# Patient Record
Sex: Female | Born: 1960 | Race: Asian | Hispanic: No | Marital: Married | Smoking: Never smoker
Health system: Southern US, Community
[De-identification: ages and names within clinical notes are randomized; demographics above are authoritative.]

## PROBLEM LIST (undated history)

## (undated) DIAGNOSIS — E785 Hyperlipidemia, unspecified: Secondary | ICD-10-CM

## (undated) DIAGNOSIS — T7840XA Allergy, unspecified, initial encounter: Secondary | ICD-10-CM

## (undated) DIAGNOSIS — H8110 Benign paroxysmal vertigo, unspecified ear: Secondary | ICD-10-CM

## (undated) DIAGNOSIS — H57059 Tonic pupil, unspecified eye: Secondary | ICD-10-CM

## (undated) DIAGNOSIS — M1990 Unspecified osteoarthritis, unspecified site: Secondary | ICD-10-CM

## (undated) HISTORY — DX: Unspecified osteoarthritis, unspecified site: M19.90

## (undated) HISTORY — DX: Benign paroxysmal vertigo, unspecified ear: H81.10

## (undated) HISTORY — DX: Hyperlipidemia, unspecified: E78.5

## (undated) HISTORY — DX: Tonic pupil, unspecified eye: H57.059

## (undated) HISTORY — DX: Allergy, unspecified, initial encounter: T78.40XA

---

## 2007-09-22 ENCOUNTER — Encounter: Payer: Self-pay | Admitting: Family Medicine

## 2007-09-24 ENCOUNTER — Encounter: Payer: Self-pay | Admitting: Family Medicine

## 2007-12-25 ENCOUNTER — Encounter: Payer: Self-pay | Admitting: Family Medicine

## 2010-06-27 ENCOUNTER — Encounter: Payer: Self-pay | Admitting: Family Medicine

## 2012-11-11 ENCOUNTER — Encounter: Payer: Self-pay | Admitting: Family Medicine

## 2014-09-06 ENCOUNTER — Encounter: Payer: Self-pay | Admitting: Family Medicine

## 2015-09-12 ENCOUNTER — Encounter: Payer: Self-pay | Admitting: Family Medicine

## 2016-07-16 DIAGNOSIS — E7800 Pure hypercholesterolemia, unspecified: Secondary | ICD-10-CM | POA: Diagnosis not present

## 2016-07-20 DIAGNOSIS — E7800 Pure hypercholesterolemia, unspecified: Secondary | ICD-10-CM | POA: Diagnosis not present

## 2016-07-20 DIAGNOSIS — Z0000 Encounter for general adult medical examination without abnormal findings: Secondary | ICD-10-CM | POA: Diagnosis not present

## 2017-03-11 ENCOUNTER — Encounter: Payer: Self-pay | Admitting: Family Medicine

## 2017-03-27 ENCOUNTER — Encounter: Payer: Self-pay | Admitting: Family Medicine

## 2017-03-27 ENCOUNTER — Ambulatory Visit (INDEPENDENT_AMBULATORY_CARE_PROVIDER_SITE_OTHER): Payer: BLUE CROSS/BLUE SHIELD | Admitting: Family Medicine

## 2017-03-27 VITALS — BP 166/81 | HR 60 | Temp 97.8°F | Resp 16 | Ht <= 58 in | Wt 115.4 lb

## 2017-03-27 DIAGNOSIS — Z1231 Encounter for screening mammogram for malignant neoplasm of breast: Secondary | ICD-10-CM | POA: Diagnosis not present

## 2017-03-27 DIAGNOSIS — R030 Elevated blood-pressure reading, without diagnosis of hypertension: Secondary | ICD-10-CM

## 2017-03-27 DIAGNOSIS — E7800 Pure hypercholesterolemia, unspecified: Secondary | ICD-10-CM | POA: Insufficient documentation

## 2017-03-27 DIAGNOSIS — Z8742 Personal history of other diseases of the female genital tract: Secondary | ICD-10-CM | POA: Diagnosis not present

## 2017-03-27 DIAGNOSIS — Z0000 Encounter for general adult medical examination without abnormal findings: Secondary | ICD-10-CM | POA: Diagnosis not present

## 2017-03-27 DIAGNOSIS — Z131 Encounter for screening for diabetes mellitus: Secondary | ICD-10-CM

## 2017-03-27 DIAGNOSIS — R102 Pelvic and perineal pain: Secondary | ICD-10-CM | POA: Diagnosis not present

## 2017-03-27 DIAGNOSIS — Z23 Encounter for immunization: Secondary | ICD-10-CM | POA: Diagnosis not present

## 2017-03-27 DIAGNOSIS — Z124 Encounter for screening for malignant neoplasm of cervix: Secondary | ICD-10-CM | POA: Diagnosis not present

## 2017-03-27 LAB — POCT URINALYSIS DIP (MANUAL ENTRY)
Bilirubin, UA: NEGATIVE
Glucose, UA: NEGATIVE mg/dL
Ketones, POC UA: NEGATIVE mg/dL
LEUKOCYTES UA: NEGATIVE
Nitrite, UA: NEGATIVE
PROTEIN UA: NEGATIVE mg/dL
SPEC GRAV UA: 1.01 (ref 1.010–1.025)
UROBILINOGEN UA: 0.2 U/dL
pH, UA: 7 (ref 5.0–8.0)

## 2017-03-27 NOTE — Patient Instructions (Addendum)
Recommend 3 servings of dairy (yogurt, skim or 2% milk, cheese, or ice cream) daily. Check blood pressure once daily every morning.  Bring blood pressure monitor with you to next visit in 4-6 weeks.   IF you received an x-ray today, you will receive an invoice from [facility]. Please contact [facility] at [phone] with questions or concerns regarding your invoice.   IF you received labwork today, you will receive an invoice from [facility]. Please contact [facility] at [phone] with questions or concerns regarding your invoice.   Our billing staff will not be able to assist you with questions regarding bills from these companies.  You will be contacted with the lab results as soon as they are available. The fastest way to get your results is to activate your My Chart account. Instructions are located on the last page of this paperwork. If you have not heard from [place] regarding the results in 2 weeks, please contact this office.     We recommend that you schedule a mammogram for breast cancer screening. Typically, you do not need a referral to do this. Please contact a local imaging center to schedule your mammogram.  [facility] - [phone]  *ask for the [facility] [place] ([place]) - [phone] or [phone]  [facility] - [phone] [place] [phone] [facility] - [phone]  *ask for the [facility] - [phone]  *ask for the Radiology Department [facility] - [phone]  *ask for the [place] - [phone]  Preventive Care 40-64 Years, Female Preventive care refers to lifestyle choices and visits with your health care provider that can promote health and wellness. What does preventive care include?  A yearly physical exam. This is also called an annual well  check.  Dental exams once or twice a year.  Routine eye exams. Ask your health care provider how often you should have your eyes checked.  Personal lifestyle choices, including:  Daily care of your teeth and gums.  Regular physical activity.  Eating a healthy diet.  Avoiding tobacco and drug use.  Limiting alcohol use.  Practicing safe sex.  Taking low-dose aspirin daily starting at age 46.  Taking vitamin and mineral supplements as recommended by your health care provider. What happens during an annual well check? The services and screenings done by your health care provider during your annual well check will depend on your age, overall health, lifestyle risk factors, and family history of disease. Counseling  Your health care provider may ask you questions about your:  Alcohol use.  Tobacco use.  Drug use.  Emotional well-being.  Home and relationship well-being.  Sexual activity.  Eating habits.  Work and work Statistician.  Method of birth control.  Menstrual cycle.  Pregnancy history. Screening  You may have the following tests or measurements:  Height, weight, and BMI.  Blood pressure.  Lipid and cholesterol levels. These may be checked every 5 years, or more frequently if you are over 61 years old.  Skin check.  Lung cancer screening. You may have this screening every year starting at age 65 if you have a 30-pack-year history of smoking and currently smoke or have quit within the past 15 years.  Fecal occult blood test (FOBT) of the stool. You may have this test every year starting at age 40.  Flexible sigmoidoscopy or colonoscopy. You may have a sigmoidoscopy every  5 years or a colonoscopy every 10 years starting at age 80.  Hepatitis C blood test.  Hepatitis B blood test.  Sexually transmitted disease (STD) testing.  Diabetes screening. This is done by checking your blood sugar (glucose) after you have not eaten for a while (fasting).  You may have this done every 1-3 years.  Mammogram. This may be done every 1-2 years. Talk to your health care provider about when you should start having regular mammograms. This may depend on whether you have a family history of breast cancer.  BRCA-related cancer screening. This may be done if you have a family history of breast, ovarian, tubal, or peritoneal cancers.  Pelvic exam and Pap test. This may be done every 3 years starting at age 69. Starting at age 44, this may be done every 5 years if you have a Pap test in combination with an HPV test.  Bone density scan. This is done to screen for osteoporosis. You may have this scan if you are at high risk for osteoporosis. Discuss your test results, treatment options, and if necessary, the need for more tests with your health care provider. Vaccines  Your health care provider may recommend certain vaccines, such as:  Influenza vaccine. This is recommended every year.  Tetanus, diphtheria, and acellular pertussis (Tdap, Td) vaccine. You may need a Td booster every 10 years.  Varicella vaccine. You may need this if you have not been vaccinated.  Zoster vaccine. You may need this after age 93.  Measles, mumps, and rubella (MMR) vaccine. You may need at least one dose of MMR if you were born in 1957 or later. You may also need a second dose.  Pneumococcal 13-valent conjugate (PCV13) vaccine. You may need this if you have certain conditions and were not previously vaccinated.  Pneumococcal polysaccharide (PPSV23) vaccine. You may need one or two doses if you smoke cigarettes or if you have certain conditions.  Meningococcal vaccine. You may need this if you have certain conditions.  Hepatitis A vaccine. You may need this if you have certain conditions or if you travel or work in places where you may be exposed to hepatitis A.  Hepatitis B vaccine. You may need this if you have certain conditions or if you travel or work in places where  you may be exposed to hepatitis B.  Haemophilus influenzae type b (Hib) vaccine. You may need this if you have certain conditions. Talk to your health care provider about which screenings and vaccines you need and how often you need them. This information is not intended to replace advice given to you by your health care provider. Make sure you discuss any questions you have with your health care provider. Document Released: 11/11/2015 Document Revised: 07/04/2016 Document Reviewed: 08/16/2015 Elsevier Interactive Patient Education  2017 [employer].

## 2017-03-27 NOTE — Progress Notes (Signed)
Subjective:    Patient ID: Tracy Richard, female    DOB: 1961-06-08, 56 y.o.   MRN: [id number]  03/27/2017  Annual Exam (with pap and has paperwork) and Immunizations (discuss getting Tdap)   HPI This 56 y.o. female presents for Complete Physical Examination and to establish care.  Last physical:  07-2016 Pap smear:  Not sure; no abnormal pap smears.  History of ovarian cysts.  LMP 2007 Mammogram:  2016 Colonoscopy:  Plans to complete in [place] this summer. Eye exam: 2014; +glasses; idiopathic dilated pupil.   Dental exam:  Last week.   Visual Acuity Screening   Right eye Left eye Both eyes  Without correction:     With correction: 20/25 20/25 20/25    Immunization History  Administered Date(s) Administered  . Tdap 03/27/2017   BP Readings from Last 3 Encounters:  03/27/17 (!) 166/81   Wt Readings from Last 3 Encounters:  03/27/17 115 lb 6.4 oz (52.3 kg)    Hypercholesterolemia: Patient reports good compliance with medication, good tolerance to medication, and good symptom control.  Would desire trial of no medication; would like to HOLD Simvastatin. Previous 40mg  and has decreased to 20mg ; decrease in 2016.   Has been taking Simvastatin 20mg  qod since 10/2016.Marland Kitchen  Hypertension: no previous dx.    History of ovarian cyst: cannot afford repeat US.  Plans to perform at [facility].   Review of Systems  Constitutional: Negative for activity change, appetite change, chills, diaphoresis, fatigue, fever and unexpected weight change.  HENT: Positive for tinnitus. Negative for congestion, dental problem, drooling, ear discharge, ear pain, facial swelling, hearing loss, mouth sores, nosebleeds, postnasal drip, rhinorrhea, sinus pressure, sneezing, sore throat, trouble swallowing and voice change.   Eyes: Negative for photophobia, pain, discharge, redness, itching and visual disturbance.  Respiratory: Negative for apnea, cough, choking, chest tightness, shortness of breath,  wheezing and stridor.   Cardiovascular: Negative for chest pain, palpitations and leg swelling.  Gastrointestinal: Negative for abdominal distention, abdominal pain, anal bleeding, blood in stool, constipation, diarrhea, nausea, rectal pain and vomiting.  Endocrine: Negative for cold intolerance, heat intolerance, polydipsia, polyphagia and polyuria.  Genitourinary: Positive for pelvic pain. Negative for decreased urine volume, difficulty urinating, dyspareunia, dysuria, enuresis, flank pain, frequency, genital sores, hematuria, menstrual problem, urgency, vaginal bleeding, vaginal discharge and vaginal pain.       Nocturia x 1.  Musculoskeletal: Negative for arthralgias, back pain, gait problem, joint swelling, myalgias, neck pain and neck stiffness.  Skin: Negative for color change, pallor, rash and wound.  Allergic/Immunologic: Negative for environmental allergies, food allergies and immunocompromised state.  Neurological: Negative for dizziness, tremors, seizures, syncope, facial asymmetry, speech difficulty, weakness, light-headedness, numbness and headaches.  Hematological: Negative for adenopathy. Does not bruise/bleed easily.  Psychiatric/Behavioral: Positive for sleep disturbance. Negative for agitation, behavioral problems, confusion, decreased concentration, dysphoric mood, hallucinations, self-injury and suicidal ideas. The patient is not nervous/anxious and is not hyperactive.        Bedtime 12:00am; wakes up at 7:30am.     Past Medical History:  Diagnosis Date  . Adie's pupil syndrome   . Allergy   . Arthritis   . BPV (benign positional vertigo)   . Hyperlipidemia    History reviewed. No pertinent surgical history. Allergies  Allergen Reactions  . Pollen Extract    Current Outpatient Prescriptions  Medication Sig Dispense Refill  . multivitamin-lutein (OCUVITE-LUTEIN) CAPS capsule Take 1 capsule by mouth daily.    . simvastatin (ZOCOR) 20 MG tablet Take 20 mg by  mouth  daily.     No current facility-administered medications for this visit.    Social History   Social History  . Marital status: Married    Spouse name: N/A  . Number of children: 0  . Years of education: N/A   Occupational History  . unemployed    Social History Main Topics  . Smoking status: Never Smoker  . Smokeless tobacco: Never Used  . Alcohol use No  . Drug use: No  . Sexual activity: Not on file   Other Topics Concern  . Not on file   Social History Narrative   Marital status: married since 1992; happily married; from [place]; moved to [place] in 2004.  [place].       Children: none       Lives: with husbandn      Employment: unemployed; VISA restriction and cannot work; prior Land[employer] of [employer] until [employer].  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  [place]       Tobacco: none      Alcohol: none; rare      Drugs: none      Exercise:  situps every night; stretching every night.                 Family History  Problem Relation Age of Onset  . Alzheimer's disease Mother 6680       Alzheimer's dementia  . Hypertension Mother   . Cancer Father [id number]       Lung cancer  . Hyperlipidemia Father   . Hypertension Father   . Hypertension Sister   . Hypertension Brother   . Hypertension Sister        Objective:    BP (!) 166/81   Pulse 60   Temp 97.8 F (36.6 C) (Oral)   Resp 16   Ht 4' 9.5" (1.461 m)   Wt 115 lb 6.4 oz (52.3 kg)   SpO2 100%   BMI 24.54 kg/m  Physical Exam  Constitutional: She is oriented to person, place, and time. She appears well-developed and well-nourished. No distress.  HENT:  Head: Normocephalic and atraumatic.  Right Ear: External ear normal.  Left Ear: External ear normal.  Nose: Nose normal.  Mouth/Throat: Oropharynx is clear and moist.  Eyes: Conjunctivae and EOM are normal. Pupils are equal, round, and reactive to light.  Neck: Normal range of motion and full passive range of motion  without pain. Neck supple. No JVD present. Carotid bruit is not present. No thyromegaly present.  Cardiovascular: Normal rate, regular rhythm and normal heart sounds.  Exam reveals no gallop and no friction rub.   No murmur heard. Pulmonary/Chest: Effort normal and breath sounds normal. She has no wheezes. She has no rales. Right breast exhibits no inverted nipple, no mass, no nipple discharge, no skin change and no tenderness. Left breast exhibits no inverted nipple, no mass, no nipple discharge, no skin change and no tenderness. Breasts are symmetrical.  Abdominal: Soft. Bowel sounds are normal. She exhibits no distension and no mass. There is no tenderness. There is no rebound and no guarding.  Genitourinary: Vagina normal. There is no rash, tenderness, lesion or injury on the right labia. There is no rash, tenderness, lesion or injury on the left labia.  Musculoskeletal:       Right shoulder: Normal.       Left shoulder: Normal.       Cervical back: Normal.  Lymphadenopathy:    She  has no cervical adenopathy.  Neurological: She is alert and oriented to person, place, and time. She has normal reflexes. No cranial nerve deficit. She exhibits normal muscle tone. Coordination normal.  Skin: Skin is warm and dry. No rash noted. She is not diaphoretic. No erythema. No pallor.  Psychiatric: She has a normal mood and affect. Her behavior is normal. Judgment and thought content normal.  Nursing note and vitals reviewed.  Results for orders placed or performed in visit on 03/27/17  POCT urinalysis dipstick  Result Value Ref Range   Color, UA light yellow (A) yellow   Clarity, UA clear clear   Glucose, UA negative negative mg/dL   Bilirubin, UA negative negative   Ketones, POC UA negative negative mg/dL   Spec Grav, UA 1.191 4.782 - 1.025   Blood, UA trace-intact (A) negative   pH, UA 7.0 5.0 - 8.0   Protein Ur, POC negative negative mg/dL   Urobilinogen, UA 0.2 0.2 or 1.0 E.U./dL   Nitrite,  UA Negative Negative   Leukocytes, UA Negative Negative   Depression screen PHQ 2/9 03/27/2017  Decreased Interest 0  Down, Depressed, Hopeless 0  PHQ - 2 Score 0   Fall Risk  03/27/2017  Falls in the past year? No       Assessment & Plan:   1. Routine physical examination   2. Pure hypercholesterolemia   3. Elevated blood pressure reading   4. Screening for diabetes mellitus   5. Breast cancer screening by mammogram   6. Need for Tdap vaccination   7. Cervical cancer screening   8. Pelvic pain in female   9. History of ovarian cyst    -anticipatory guidance provided --- exercise, weight loss, safe driving practices, aspirin 81mg  daily. -obtain age appropriate screening labs and labs for chronic disease management. -pap smear obtained; refer for mammogram; s/p TDAP -agree with holding Simvastatin for six months; obtain FLP today. -blood pressure elevated; RTC 4-6 weeks for recheck.  Obtain EKG at that visit.  Check BP daily at home and bring in BP cuff to next visit. -pt plans to undergo colonoscopy and pelvic US in [place] this summer as more affordable.   Orders Placed This Encounter  Procedures  . MM SCREENING BREAST TOMO BILATERAL    Standing Status:   Future    Standing Expiration Date:   05/28/2018    Order Specific Question:   Reason for Exam (SYMPTOM  OR DIAGNOSIS REQUIRED)    Answer:   annual screening    Order Specific Question:   Is the patient pregnant?    Answer:   No    Order Specific Question:   Preferred imaging location?    Answer:   [facility]  . Tdap vaccine greater than or equal to 7yo IM  . CBC with Differential/Platelet  . Comprehensive metabolic panel    Order Specific Question:   Has the patient fasted?    Answer:   Yes  . Hemoglobin A1c  . Lipid panel    Order Specific Question:   Has the patient fasted?    Answer:   Yes  . TSH  . POCT urinalysis dipstick   Meds ordered this encounter  Medications  . simvastatin (ZOCOR) 20 MG  tablet    Sig: Take 20 mg by mouth daily.  . multivitamin-lutein (OCUVITE-LUTEIN) CAPS capsule    Sig: Take 1 capsule by mouth daily.    Return in about 6 weeks (around 05/08/2017) for recheck blood pressure.  Kristi Elayne Guerin, M.D. Primary Care at [facility] previously [facility] [address] [place], [place]  [address] [phone] phone [phone] fax

## 2017-03-27 NOTE — Progress Notes (Signed)
   Subjective:    Patient ID: Tracy Richard, female    DOB: 10/07/1961, 56 y.o.   MRN: [id number]  HPI    Review of Systems  Allergic/Immunologic: Positive for food allergies.       Seasonal allergies       Objective:   Physical Exam        Assessment & Plan:

## 2017-03-28 LAB — COMPREHENSIVE METABOLIC PANEL
A/G RATIO: 1.6 (ref 1.2–2.2)
ALT: 24 IU/L (ref 0–32)
AST: 25 IU/L (ref 0–40)
Albumin: 4.9 g/dL (ref 3.5–5.5)
Alkaline Phosphatase: 90 IU/L (ref 39–117)
BUN/Creatinine Ratio: 13 (ref 9–23)
BUN: 9 mg/dL (ref 6–24)
Bilirubin Total: 0.4 mg/dL (ref 0.0–1.2)
CALCIUM: 10 mg/dL (ref 8.7–10.2)
CO2: 25 mmol/L (ref 18–29)
Chloride: 104 mmol/L (ref 96–106)
Creatinine, Ser: 0.69 mg/dL (ref 0.57–1.00)
GFR, EST AFRICAN AMERICAN: 112 mL/min/{1.73_m2} (ref >59)
GFR, EST NON AFRICAN AMERICAN: 97 mL/min/{1.73_m2} (ref >59)
Globulin, Total: 3.1 g/dL (ref 1.5–4.5)
Glucose: 98 mg/dL (ref 65–99)
POTASSIUM: 4.1 mmol/L (ref 3.5–5.2)
SODIUM: 143 mmol/L (ref 134–144)
TOTAL PROTEIN: 8 g/dL (ref 6.0–8.5)

## 2017-03-28 LAB — CBC WITH DIFFERENTIAL/PLATELET
BASOS: 1 %
Basophils Absolute: 0 10*3/uL (ref 0.0–0.2)
EOS (ABSOLUTE): 0.1 10*3/uL (ref 0.0–0.4)
EOS: 2 %
HEMATOCRIT: 41.6 % (ref 34.0–46.6)
Hemoglobin: 13.3 g/dL (ref 11.1–15.9)
IMMATURE GRANULOCYTES: 0 %
Immature Grans (Abs): 0 10*3/uL (ref 0.0–0.1)
LYMPHS: 29 %
Lymphocytes Absolute: 1.1 10*3/uL (ref 0.7–3.1)
MCH: 28.8 pg (ref 26.6–33.0)
MCHC: 32 g/dL (ref 31.5–35.7)
MCV: 90 fL (ref 79–97)
MONOS ABS: 0.3 10*3/uL (ref 0.1–0.9)
Monocytes: 8 %
NEUTROS PCT: 60 %
Neutrophils Absolute: 2.2 10*3/uL (ref 1.4–7.0)
Platelets: 235 10*3/uL (ref 150–379)
RBC: 4.62 x10E6/uL (ref 3.77–5.28)
RDW: 13.7 % (ref 12.3–15.4)
WBC: 3.6 10*3/uL (ref 3.4–10.8)

## 2017-03-28 LAB — LIPID PANEL
Chol/HDL Ratio: 2 ratio (ref 0.0–4.4)
Cholesterol, Total: 173 mg/dL (ref 100–199)
HDL: 87 mg/dL (ref >39)
LDL CALC: 72 mg/dL (ref 0–99)
Triglycerides: 71 mg/dL (ref 0–149)
VLDL CHOLESTEROL CAL: 14 mg/dL (ref 5–40)

## 2017-03-28 LAB — HEMOGLOBIN A1C
Est. average glucose Bld gHb Est-mCnc: 103 mg/dL
Hgb A1c MFr Bld: 5.2 % (ref 4.8–5.6)

## 2017-03-28 LAB — TSH: TSH: 1.24 u[IU]/mL (ref 0.450–4.500)

## 2017-03-30 LAB — PAP IG AND HPV HIGH-RISK
HPV, high-risk: NEGATIVE
PAP SMEAR COMMENT: 0

## 2017-05-02 ENCOUNTER — Encounter (HOSPITAL_BASED_OUTPATIENT_CLINIC_OR_DEPARTMENT_OTHER): Payer: Self-pay

## 2017-05-02 ENCOUNTER — Ambulatory Visit (HOSPITAL_BASED_OUTPATIENT_CLINIC_OR_DEPARTMENT_OTHER)
Admission: RE | Admit: 2017-05-02 | Discharge: 2017-05-02 | Disposition: A | Discharge status: Home / Self Care | Payer: BLUE CROSS/BLUE SHIELD | Source: Ambulatory Visit | Attending: Family Medicine | Admitting: Family Medicine

## 2017-05-02 DIAGNOSIS — Z1231 Encounter for screening mammogram for malignant neoplasm of breast: Secondary | ICD-10-CM | POA: Diagnosis not present

## 2017-05-03 ENCOUNTER — Other Ambulatory Visit: Payer: Self-pay | Admitting: Family Medicine

## 2017-05-03 DIAGNOSIS — R928 Other abnormal and inconclusive findings on diagnostic imaging of breast: Secondary | ICD-10-CM

## 2017-05-15 ENCOUNTER — Ambulatory Visit (INDEPENDENT_AMBULATORY_CARE_PROVIDER_SITE_OTHER): Payer: BLUE CROSS/BLUE SHIELD | Admitting: Family Medicine

## 2017-05-15 ENCOUNTER — Encounter: Payer: Self-pay | Admitting: Family Medicine

## 2017-05-15 VITALS — BP 148/80 | HR 81 | Temp 98.7°F | Resp 18 | Ht 59.45 in | Wt 115.0 lb

## 2017-05-15 DIAGNOSIS — Z114 Encounter for screening for human immunodeficiency virus [HIV]: Secondary | ICD-10-CM

## 2017-05-15 DIAGNOSIS — Z1159 Encounter for screening for other viral diseases: Secondary | ICD-10-CM | POA: Diagnosis not present

## 2017-05-15 DIAGNOSIS — E7800 Pure hypercholesterolemia, unspecified: Secondary | ICD-10-CM | POA: Diagnosis not present

## 2017-05-15 DIAGNOSIS — R030 Elevated blood-pressure reading, without diagnosis of hypertension: Secondary | ICD-10-CM

## 2017-05-15 DIAGNOSIS — Z23 Encounter for immunization: Secondary | ICD-10-CM

## 2017-05-15 MED ORDER — SIMVASTATIN 20 MG PO TABS: 20.0000 mg | ORAL_TABLET | Freq: Every day | ORAL | 1 refills | Status: DC

## 2017-05-15 NOTE — Progress Notes (Signed)
Subjective:    Patient ID: Tracy Richard, female    DOB: 07/21/1961, 56 y.o.   MRN: [id number]  05/15/2017  Hypertension (6 week follow-up)   HPI This 56 y.o. female presents for evaluation of hypertension.   Exercises at gardens every day. Checking sguars daily; 95/100/92/104/99/ usually fasting. BP 124/78, 125/75.   [place] twice per week.   anticipatory guidance provided --- exercise, weight loss, safe driving practices, aspirin 81mg  daily. -obtain age appropriate screening labs and labs for chronic disease management. -pap smear obtained; refer for mammogram; s/p TDAP -agree with holding Simvastatin for six months; obtain FLP today. -blood pressure elevated; RTC 4-6 weeks for recheck.  Obtain EKG at that visit.  Check BP daily at home and bring in BP cuff to next visit. -pt plans to undergo colonoscopy and pelvic us in [place] this    S/p Hepatitis B vacines in [place].  No Hepatitis A vaccine in the past.  Due for Hepatitis C screening and HIV.   BP Readings from Last 3 Encounters:  05/15/17 (!) 148/80  03/27/17 (!) 166/81   Wt Readings from Last 3 Encounters:  05/15/17 115 lb (52.2 kg)  03/27/17 115 lb 6.4 oz (52.3 kg)   Immunization History  Administered Date(s) Administered  . Hepatitis A, Adult 05/15/2017  . Tdap 03/27/2017    Review of Systems  Constitutional: Negative for chills, diaphoresis, fatigue and fever.  Eyes: Negative for visual disturbance.  Respiratory: Negative for cough and shortness of breath.   Cardiovascular: Negative for chest pain, palpitations and leg swelling.  Gastrointestinal: Negative for abdominal pain, constipation, diarrhea, nausea and vomiting.  Endocrine: Negative for cold intolerance, heat intolerance, polydipsia, polyphagia and polyuria.  Neurological: Negative for dizziness, tremors, seizures, syncope, facial asymmetry, speech difficulty, weakness, light-headedness, numbness and headaches.  Psychiatric/Behavioral:  Negative for decreased concentration and dysphoric mood. The patient is not nervous/anxious.     Past Medical History:  Diagnosis Date  . Adie's pupil syndrome   . Allergy   . Arthritis   . BPV (benign positional vertigo)   . Hyperlipidemia    History reviewed. No pertinent surgical history. Allergies  Allergen Reactions  . Pollen Extract     Social History   Social History  . Marital status: Married    Spouse name: N/A  . Number of children: 0  . Years of education: N/A   Occupational History  . unemployed    Social History Main Topics  . Smoking status: Never Smoker  . Smokeless tobacco: Never Used  . Alcohol use No  . Drug use: No  . Sexual activity: Not on file   Other Topics Concern  . Not on file   Social History Narrative   Marital status: married since 1992; happily married; from [place]; moved to [place] in [place].  [place] citizens.       Children: none       Lives: with husbandn      Employment: unemployed; VISA restriction and cannot work; prior Land[employer]er of [employer] until [employer].  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  [place]       Tobacco: none      Alcohol: none; rare      Drugs: none      Exercise:  situps every night; stretching every night.                 Family History  Problem Relation Age of Onset  . Alzheimer's disease Mother 1080  Alzheimer's dementia  . Hypertension Mother   . Cancer Father 58       Lung cancer  . Hyperlipidemia Father   . Hypertension Father   . Hypertension Sister   . Hypertension Brother   . Hypertension Sister        Objective:    BP (!) 148/80   Pulse 81   Temp 98.7 F (37.1 C) (Oral)   Resp 18   Ht 4' 11.45" (1.51 m)   Wt 115 lb (52.2 kg)   SpO2 97%   BMI 22.88 kg/m  Physical Exam  Constitutional: She is oriented to person, place, and time. She appears well-developed and well-nourished. No distress.  HENT:  Head: Normocephalic and atraumatic.  Right Ear:  External ear normal.  Left Ear: External ear normal.  Nose: Nose normal.  Mouth/Throat: Oropharynx is clear and moist.  Eyes: Pupils are equal, round, and reactive to light. Conjunctivae and EOM are normal.  Neck: Normal range of motion. Neck supple. Carotid bruit is not present. No thyromegaly present.  Cardiovascular: Normal rate, regular rhythm, normal heart sounds and intact distal pulses.  Exam reveals no gallop and no friction rub.   No murmur heard. Pulmonary/Chest: Effort normal and breath sounds normal. She has no wheezes. She has no rales.  Abdominal: Soft. Bowel sounds are normal. She exhibits no distension and no mass. There is no tenderness. There is no rebound and no guarding.  Lymphadenopathy:    She has no cervical adenopathy.  Neurological: She is alert and oriented to person, place, and time. No cranial nerve deficit.  Skin: Skin is warm and dry. No rash noted. She is not diaphoretic. No erythema. No pallor.  Psychiatric: She has a normal mood and affect. Her behavior is normal.        Assessment & Plan:   1. Pure hypercholesterolemia   2. Blood pressure elevated without history of HTN   3. Need for hepatitis C screening test   4. Screening for HIV (human immunodeficiency virus)   5. Need for hepatitis A vaccination    -trial off of statin therapy for six months. -blood pressures at home normal; continue to monitor closely. -obtain age appropriate screening labs. -s/p Hepatitis A#1.  RTC six months for #2.   Orders Placed This Encounter  Procedures  . Hepatitis A vaccine adult IM   Meds ordered this encounter  Medications  . simvastatin (ZOCOR) 20 MG tablet    Sig: Take 1 tablet (20 mg total) by mouth daily.    Dispense:  90 tablet    Refill:  1    Return in about 6 months (around 11/15/2017) for recheck high cholesterol, hepatitis A vaccine#2.   Vinson Tietze Paulita Fujita, M.D. Primary Care at [facility] previously [facility] [address] [place], [place]  [address] [phone] phone [phone] fax

## 2017-05-15 NOTE — Patient Instructions (Signed)
     IF you received an x-ray today, you will receive an invoice from [facility]. Please contact [facility] at [phone] with questions or concerns regarding your invoice.   IF you received labwork today, you will receive an invoice from [facility]. Please contact [facility] at [phone] with questions or concerns regarding your invoice.   Our billing staff will not be able to assist you with questions regarding bills from these companies.  You will be contacted with the lab results as soon as they are available. The fastest way to get your results is to activate your My Chart account. Instructions are located on the last page of this paperwork. If you have not heard from us regarding the results in 2 weeks, please contact this office.     

## 2017-06-17 DIAGNOSIS — K5730 Diverticulosis of large intestine without perforation or abscess without bleeding: Secondary | ICD-10-CM | POA: Diagnosis not present

## 2017-06-25 DIAGNOSIS — K5730 Diverticulosis of large intestine without perforation or abscess without bleeding: Secondary | ICD-10-CM | POA: Diagnosis not present

## 2017-10-15 ENCOUNTER — Telehealth: Payer: Self-pay | Admitting: Family Medicine

## 2017-10-15 NOTE — Telephone Encounter (Signed)
Called to reschedule pt for the cancelled appt from 11/20/17. Please reschedule her for appt any day with the exception of 11/11/17 OR 11/20/17.  Thanks!

## 2017-11-06 ENCOUNTER — Other Ambulatory Visit: Payer: Self-pay | Admitting: Family Medicine

## 2017-11-20 ENCOUNTER — Ambulatory Visit: Payer: BLUE CROSS/BLUE SHIELD | Admitting: Family Medicine

## 2017-12-05 ENCOUNTER — Ambulatory Visit (INDEPENDENT_AMBULATORY_CARE_PROVIDER_SITE_OTHER): Payer: BLUE CROSS/BLUE SHIELD

## 2017-12-05 ENCOUNTER — Encounter: Payer: Self-pay | Admitting: Family Medicine

## 2017-12-05 ENCOUNTER — Telehealth: Payer: Self-pay | Admitting: Family Medicine

## 2017-12-05 ENCOUNTER — Ambulatory Visit (INDEPENDENT_AMBULATORY_CARE_PROVIDER_SITE_OTHER): Payer: BLUE CROSS/BLUE SHIELD | Admitting: Family Medicine

## 2017-12-05 ENCOUNTER — Other Ambulatory Visit: Payer: Self-pay

## 2017-12-05 VITALS — BP 144/82 | HR 71 | Temp 98.1°F | Ht 60.0 in | Wt 120.2 lb

## 2017-12-05 DIAGNOSIS — M25412 Effusion, left shoulder: Secondary | ICD-10-CM

## 2017-12-05 DIAGNOSIS — M25512 Pain in left shoulder: Secondary | ICD-10-CM

## 2017-12-05 DIAGNOSIS — M19012 Primary osteoarthritis, left shoulder: Secondary | ICD-10-CM | POA: Diagnosis not present

## 2017-12-05 LAB — POCT CBC
Granulocyte percent: 7.1 %G — AB (ref 37–80)
HCT, POC: 38.8 % (ref 37.7–47.9)
Hemoglobin: 12.8 g/dL (ref 12.2–16.2)
Lymph, poc: 1.1 (ref 0.6–3.4)
MCH, POC: 29 pg (ref 27–31.2)
MCHC: 32.9 g/dL (ref 31.8–35.4)
MCV: 89 fL (ref 80–97)
MID (cbc): 0.4 (ref 0–0.9)
MPV: 5.9 fL (ref 0–99.8)
POC Granulocyte: 4.5 (ref 2–6.9)
POC LYMPH PERCENT: 18.4 %L (ref 10–50)
POC MID %: 7.1 %M (ref 0–12)
Platelet Count, POC: 316 10*3/uL (ref 142–424)
RBC: 4.36 M/uL (ref 4.04–5.48)
RDW, POC: 13.3 %
WBC: 6 10*3/uL (ref 4.6–10.2)

## 2017-12-05 LAB — POCT SEDIMENTATION RATE: POCT SED RATE: 115 mm/hr — AB (ref 0–22)

## 2017-12-05 MED ORDER — TRAMADOL HCL 50 MG PO TABS: 50.0000 mg | ORAL_TABLET | Freq: Three times a day (TID) | ORAL | 0 refills | Status: DC | PRN

## 2017-12-05 MED ORDER — MELOXICAM 7.5 MG PO TABS: 7.5000 mg | ORAL_TABLET | Freq: Every day | ORAL | 0 refills | Status: DC

## 2017-12-05 MED ORDER — METHYLPREDNISOLONE 4 MG PO TBPK: ORAL_TABLET | ORAL | 0 refills | Status: DC

## 2017-12-05 NOTE — Telephone Encounter (Signed)
done

## 2017-12-05 NOTE — Telephone Encounter (Signed)
Copied from CRM [id number]. Topic: Quick Communication - See Telephone Encounter >> Dec 05, 2017 12:10 PM Diana EvesHoyt, Maryann B wrote: CRM for notification. See Telephone encounter for:  [facility] calling [facility] didn't receive the meloxicam [facility]). Please resend in  12/05/17.

## 2017-12-05 NOTE — Telephone Encounter (Signed)
I resent the prescription, however it would be really great for patient care if next time you just call it in as you can see that I prescribed it and I might not have seen this message on time. thanks

## 2017-12-05 NOTE — Patient Instructions (Addendum)
1. Do not start meloxicam until steroid course is completed. Take steroids with food.  2. Ice pack for for 15-20 minutes 2-3 times a day 3. Rest and elevate arm as needed.     IF you received an x-ray today, you will receive an invoice from [facility]. Please contact [facility] at [phone] with questions or concerns regarding your invoice.   IF you received labwork today, you will receive an invoice from [facility]. Please contact [facility] at [phone] with questions or concerns regarding your invoice.   Our billing staff will not be able to assist you with questions regarding bills from these companies.  You will be contacted with the lab results as soon as they are available. The fastest way to get your results is to activate your My Chart account. Instructions are located on the last page of this paperwork. If you have not heard from us regarding the results in 2 weeks, please contact this office.

## 2017-12-05 NOTE — Progress Notes (Signed)
2/7/201911:10 AM  Tracy IvanElsa Chermak 07/09/1961, 57 y.o. female [id number]  Chief Complaint  Patient presents with  . Shoulder Pain    LEFT SHOULDER PAIN. HAS HISTORY OF BURSITIS. HAS MEDICAL RECORDS FROM [place]    HPI:   Patient is a 57 y.o. female with past medical history significant for right shoulder bursitis several years ago who presents today for LEFT shoulder pain, redness and warmth. Really hurts if she tries to raise or lift anything with it. She denies any trauma or inciting event. She denies any fever or chills. She denies any other joint involvement or recent illness. She did really well with tramadol, medrol pac and meloxicam last time. Was wondering if we can do the same this time.   [facility] 2[facility] 12/05/2017 05/15/2017 03/27/2017  Decreased Interest 0 0 0  Down, Depressed, Hopeless 0 0 0  PHQ - 2 Score 0 0 0    Allergies  Allergen Reactions  . Pollen Extract     Prior to Admission medications   Medication Sig Start Date End Date Taking? Authorizing Provider  multivitamin-lutein (OCUVITE-LUTEIN) CAPS capsule Take 1 capsule by mouth daily.   Yes [provider]  simvastatin (ZOCOR) 20 MG tablet TAKE 1 TABLET BY MOUTH EVERY DAY 11/06/17  Yes Ethelda ChickSmith, Kristi M, MD    Past Medical History:  Diagnosis Date  . Adie's pupil syndrome   . Allergy   . Arthritis   . BPV (benign positional vertigo)   . Hyperlipidemia     History reviewed. No pertinent surgical history.  Social History   Tobacco Use  . Smoking status: Never Smoker  . Smokeless tobacco: Never Used  Substance Use Topics  . Alcohol use: No    Family History  Problem Relation Age of Onset  . Alzheimer's disease Mother 4180       Alzheimer's dementia  . Hypertension Mother   . Cancer Father 2265       Lung cancer  . Hyperlipidemia Father   . Hypertension Father   . Hypertension Sister   . Hypertension Brother   . Hypertension Sister     ROS Per hpi  OBJECTIVE:  Blood pressure (!)  144/82, pulse 71, temperature 98.1 F (36.7 C), temperature source Oral, height 5' (1.524 m), weight 120 lb 3.2 oz (54.5 kg), SpO2 98 %.  Physical Exam  Gen: AAOx3, NAD Left shoulder red hot and swollen, FROM but painful. Strength   Results for orders placed or performed in visit on 12/05/17 (from the past 24 hour(s))  POCT CBC     Status: Abnormal   Collection Time: 12/05/17 11:44 AM  Result Value Ref Range   WBC 6.0 4.6 - 10.2 K/uL   Lymph, poc 1.1 0.6 - 3.4   POC LYMPH PERCENT 18.4 10 - 50 %L   MID (cbc) 0.4 0 - 0.9   POC MID % 7.1 0 - 12 %M   POC Granulocyte 4.5 2 - 6.9   Granulocyte percent 7.1 (A) 37 - 80 %G   RBC 4.36 4.04 - 5.48 M/uL   Hemoglobin 12.8 12.2 - 16.2 g/dL   HCT, POC 08.638.8 57.837.7 - 47.9 %   MCV 89.0 80 - 97 fL   MCH, POC 29.0 27 - 31.2 pg   MCHC 32.9 31.8 - 35.4 g/dL   RDW, POC 46.913.3 %   Platelet Count, POC 316 142 - 424 K/uL   MPV 5.9 0 - 99.8 fL    Dg Shoulder Left  Result  Date: 12/05/2017 CLINICAL DATA:  Left shoulder pain and swelling EXAM: LEFT SHOULDER - 2+ VIEW COMPARISON:  None. FINDINGS: Mild degenerative changes of the acromioclavicular joint are noted. No acute fracture or dislocation is seen. Bony densities are noted along the greater tuberosity which may be related to some calcific tendinitis. Loose bodies cannot be totally excluded. No other focal abnormality is noted. IMPRESSION: Chronic appearing changes no acute abnormality noted. Electronically Signed   By: Alcide Clever M.D.   On: 12/05/2017 11:58     ASSESSMENT and PLAN  1. Pain and swelling of left shoulder Discussed RICE therapy, new meds r/se/b, RTC precautions. - POCT CBC - POCT SEDIMENTATION RATE - Uric Acid - DG Shoulder Left; Future  Other orders - traMADol (ULTRAM) 50 MG tablet; Take 1 tablet (50 mg total) by mouth every 8 (eight) hours as needed. - methylPREDNISolone (MEDROL DOSEPAK) 4 MG TBPK tablet; Take per pack instructions. - meloxicam (MOBIC) 7.5 MG tablet; Take 1 tablet  (7.5 mg total) by mouth daily.  Return if symptoms worsen or fail to improve.    Myles Lipps, MD Primary Care at [facility] [address] [place], [place] [address] Ph.  [phone] Fax [phone]

## 2017-12-05 NOTE — Telephone Encounter (Signed)
Can you resend Mobic RX

## 2017-12-06 LAB — URIC ACID: Uric Acid: 3.5 mg/dL (ref 2.5–7.1)

## 2017-12-06 NOTE — Telephone Encounter (Signed)
Pt notified Rx ready for pick up in 20 min per pharmacy

## 2017-12-06 NOTE — Telephone Encounter (Signed)
I called the pharmacy and they did get the RX

## 2017-12-17 NOTE — Progress Notes (Signed)
Subjective:    Patient ID: Tracy Richard, female    DOB: 04/26/1961, 57 y.o.   MRN: [id number]  12/18/2017  Hyperlipidemia (6 month follow-up )    HPI This 57 y.o. female presents for eight month follow-up of hypercholesterolemia.  Stopped statin six months ago; s/p Hepatitis A#1.  S/p colonoscopy.    [place] in [place]; bought property.  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  [place].  [place] citizens.   Had to move to [place] because [place] shut down. Very happy here.  [employer].  Very diversified.  Two oriental stores.  Much closer than in [place].  Husband is 280 years old.    Checks Bp at home sometimes.  Since holiday, not exercising regularly.  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  Shoulder much better.    Taking cholesterol every other day.   Worried that weak muscles due to statin.   Don't like gyms.  Weights are less than 2 pounds.    BP Readings from Last 3 Encounters:  12/18/17 138/82  12/05/17 (!) 144/82  05/15/17 (!) 148/80   Wt Readings from Last 3 Encounters:  12/18/17 118 lb (53.5 kg)  12/05/17 120 lb 3.2 oz (54.5 kg)  05/15/17 115 lb (52.2 kg)   Immunization History  Administered Date(s) Administered  . Hepatitis A, Adult 05/15/2017, 12/18/2017  . Tdap 03/27/2017    Review of Systems  Constitutional: Negative for chills, diaphoresis, fatigue and fever.  Eyes: Negative for visual disturbance.  Respiratory: Negative for cough and shortness of breath.   Cardiovascular: Negative for chest pain, palpitations and leg swelling.  Gastrointestinal: Negative for abdominal pain, constipation, diarrhea, nausea and vomiting.  Endocrine: Negative for cold intolerance, heat intolerance, polydipsia, polyphagia and polyuria.  Musculoskeletal: Positive for arthralgias.  Neurological: Negative for dizziness, tremors, seizures, syncope, facial asymmetry, speech difficulty, weakness, light-headedness, numbness and headaches.    Past Medical History:    Diagnosis Date  . Adie's pupil syndrome   . Allergy   . Arthritis   . BPV (benign positional vertigo)   . Hyperlipidemia    History reviewed. No pertinent surgical history. Allergies  Allergen Reactions  . Eggs Or Egg-Derived Products Itching  . Pollen Extract    Current Outpatient Medications on File Prior to Visit  Medication Sig Dispense Refill  . meloxicam (MOBIC) 7.5 MG tablet Take 1 tablet (7.5 mg total) by mouth daily. 30 tablet 0  . multivitamin-lutein (OCUVITE-LUTEIN) CAPS capsule Take 1 capsule by mouth daily.    . simvastatin (ZOCOR) 20 MG tablet TAKE 1 TABLET BY MOUTH EVERY DAY 90 tablet 1   No current facility-administered medications on file prior to visit.    Social History   Socioeconomic History  . Marital status: Married    Spouse name: Not on file  . Number of children: 0  . Years of education: Not on file  . Highest education level: Not on file  Social Needs  . Financial resource strain: Not on file  . Food insecurity - worry: Not on file  . Food insecurity - inability: Not on file  . Transportation needs - medical: Not on file  . Transportation needs - non-medical: Not on file  Occupational History  . Occupation: unemployed  Tobacco Use  . Smoking status: Never Smoker  . Smokeless tobacco: Never Used  Substance and Sexual Activity  . Alcohol use: No  . Drug use: No  . Sexual activity: Not on file  Other Topics Concern  . Not on file  Social History Narrative   Marital status: married since 1992; happily married; from [place]; moved to [place] in 2004.  [place] citizens.       Children: none       Lives: with husbandn      Employment: unemployed; VISA restriction and cannot work; prior [employer] until [employer].  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  Linton Ham from [place], [place] in 10/2016       Tobacco: none      Alcohol: none; rare      Drugs: none      Exercise:  situps every night; stretching every night.                  Family History  Problem Relation Age of Onset  . Alzheimer's disease Mother 64       Alzheimer's dementia  . Hypertension Mother   . Cancer Father 80       Lung cancer  . Hyperlipidemia Father   . Hypertension Father   . Hypertension Sister   . Hypertension Brother   . Hypertension Sister        Objective:    BP 138/82   Pulse 72   Temp 98 F (36.7 C) (Oral)   Resp 16   Wt 118 lb (53.5 kg)   SpO2 97%   BMI 23.05 kg/m  Physical Exam  Constitutional: She is oriented to person, place, and time. She appears well-developed and well-nourished. No distress.  HENT:  Head: Normocephalic and atraumatic.  Right Ear: External ear normal.  Left Ear: External ear normal.  Nose: Nose normal.  Mouth/Throat: Oropharynx is clear and moist.  Eyes: Conjunctivae and EOM are normal. Pupils are equal, round, and reactive to light.  Neck: Normal range of motion. Neck supple. Carotid bruit is not present. No thyromegaly present.  Cardiovascular: Normal rate, regular rhythm, normal heart sounds and intact distal pulses. Exam reveals no gallop and no friction rub.  No murmur heard. Pulmonary/Chest: Effort normal and breath sounds normal. She has no wheezes. She has no rales.  Abdominal: Soft. Bowel sounds are normal. She exhibits no distension and no mass. There is no tenderness. There is no rebound and no guarding.  Musculoskeletal:       Right shoulder: Normal. She exhibits normal range of motion, no tenderness, no pain, no spasm, normal pulse and normal strength.       Left shoulder: She exhibits decreased strength. She exhibits normal range of motion, no tenderness, no spasm and normal pulse.       Cervical back: Normal. She exhibits normal range of motion, no tenderness and no bony tenderness.  Lymphadenopathy:    She has no cervical adenopathy.  Neurological: She is alert and oriented to person, place, and time. No cranial nerve deficit.  Skin: Skin is warm and dry. No rash noted. She  is not diaphoretic. No erythema. No pallor.  Psychiatric: She has a normal mood and affect. Her behavior is normal.   No results found. Depression screen [facility] 2/9 12/18/2017 12/05/2017 05/15/2017 03/27/2017  Decreased Interest 0 0 0 0  Down, Depressed, Hopeless 0 0 0 0  PHQ - 2 Score 0 0 0 0   Fall Risk  12/18/2017 12/05/2017 05/15/2017 03/27/2017  Falls in the past year? No No No No        Assessment & Plan:   1. Pure hypercholesterolemia   2. Elevated blood pressure reading   3. Screening for HIV (human immunodeficiency virus)  4. Need for hepatitis C screening test   5. Need for hepatitis A immunization   6. Sprain of left rotator cuff capsule, initial encounter    Hypercholesterolemia: well controlled on current statin therapy every other day; agreeable to six month trial of holding statin therapy.  Repeat FLP at next visit in six months.  Elevated blood pressure without diagnosis: stable home BP readings; normal reading today; continue to monitor.  S/p Hepatitis C#2.  L shoulder sprain: New; improving with good range of motion; continue home exercise program.   Orders Placed This Encounter  Procedures  . Hepatitis A vaccine adult IM  . CBC with Differential/Platelet  . Comprehensive metabolic panel    Order Specific Question:   Has the patient fasted?    Answer:   No  . Lipid panel    Order Specific Question:   Has the patient fasted?    Answer:   No  . HIV antibody  . Hepatitis C antibody   No orders of the defined types were placed in this encounter.   Return in about 6 months (around 06/17/2018) for complete physical examiniation.   Emara Lichter Paulita Fujita, M.D. Primary Care at [facility] [address] [place], [place]  [address] [phone] phone [phone] fax

## 2017-12-18 ENCOUNTER — Other Ambulatory Visit: Payer: Self-pay

## 2017-12-18 ENCOUNTER — Encounter: Payer: Self-pay | Admitting: Family Medicine

## 2017-12-18 ENCOUNTER — Ambulatory Visit (INDEPENDENT_AMBULATORY_CARE_PROVIDER_SITE_OTHER): Payer: BLUE CROSS/BLUE SHIELD | Admitting: Family Medicine

## 2017-12-18 VITALS — BP 138/82 | HR 72 | Temp 98.0°F | Resp 16 | Wt 118.0 lb

## 2017-12-18 DIAGNOSIS — Z1159 Encounter for screening for other viral diseases: Secondary | ICD-10-CM | POA: Diagnosis not present

## 2017-12-18 DIAGNOSIS — S43422A Sprain of left rotator cuff capsule, initial encounter: Secondary | ICD-10-CM

## 2017-12-18 DIAGNOSIS — E7800 Pure hypercholesterolemia, unspecified: Secondary | ICD-10-CM | POA: Diagnosis not present

## 2017-12-18 DIAGNOSIS — Z114 Encounter for screening for human immunodeficiency virus [HIV]: Secondary | ICD-10-CM

## 2017-12-18 DIAGNOSIS — Z23 Encounter for immunization: Secondary | ICD-10-CM | POA: Diagnosis not present

## 2017-12-18 DIAGNOSIS — R030 Elevated blood-pressure reading, without diagnosis of hypertension: Secondary | ICD-10-CM | POA: Diagnosis not present

## 2017-12-18 NOTE — Patient Instructions (Addendum)
HOLD/STOP CHOLESTEROL MEDICATION.   High Cholesterol High cholesterol is a condition in which the blood has high levels of a white, waxy, fat-like substance (cholesterol). The human body needs small amounts of cholesterol. The liver makes all the cholesterol that the body needs. Extra (excess) cholesterol comes from the food that we eat. Cholesterol is carried from the liver by the blood through the blood vessels. If you have high cholesterol, deposits (plaques) may build up on the walls of your blood vessels (arteries). Plaques make the arteries narrower and stiffer. Cholesterol plaques increase your risk for heart attack and stroke. Work with your health care provider to keep your cholesterol levels in a healthy range. What increases the risk? This condition is more likely to develop in people who:  Eat foods that are high in animal fat (saturated fat) or cholesterol.  Are overweight.  Are not getting enough exercise.  Have a family history of high cholesterol.  What are the signs or symptoms? There are no symptoms of this condition. How is this diagnosed? This condition may be diagnosed from the results of a blood test.  If you are older than age 71, your health care provider may check your cholesterol every 4-6 years.  You may be checked more often if you already have high cholesterol or other risk factors for heart disease.  The blood test for cholesterol measures:  "Bad" cholesterol (LDL cholesterol). This is the main type of cholesterol that causes heart disease. The desired level for LDL is less than 100.  "Good" cholesterol (HDL cholesterol). This type helps to protect against heart disease by cleaning the arteries and carrying the LDL away. The desired level for HDL is 60 or higher.  Triglycerides. These are fats that the body can store or burn for energy. The desired number for triglycerides is lower than 150.  Total cholesterol. This is a measure of the total amount of  cholesterol in your blood, including LDL cholesterol, HDL cholesterol, and triglycerides. A healthy number is less than 200.  How is this treated? This condition is treated with diet changes, lifestyle changes, and medicines. Diet changes  This may include eating more whole grains, fruits, vegetables, nuts, and fish.  This may also include cutting back on red meat and foods that have a lot of added sugar. Lifestyle changes  Changes may include getting at least 40 minutes of aerobic exercise 3 times a week. Aerobic exercises include walking, biking, and swimming. Aerobic exercise along with a healthy diet can help you maintain a healthy weight.  Changes may also include quitting smoking. Medicines  Medicines are usually given if diet and lifestyle changes have failed to reduce your cholesterol to healthy levels.  Your health care provider may prescribe a statin medicine. Statin medicines have been shown to reduce cholesterol, which can reduce the risk of heart disease. Follow these instructions at home: Eating and drinking  If told by your health care provider:  Eat chicken (without skin), fish, veal, shellfish, ground [place] breast, and round or loin cuts of red meat.  Do not eat fried foods or fatty meats, such as hot dogs and salami.  Eat plenty of fruits, such as apples.  Eat plenty of vegetables, such as broccoli, potatoes, and carrots.  Eat beans, peas, and lentils.  Eat grains such as barley, rice, couscous, and bulgur wheat.  Eat pasta without cream sauces.  Use skim or nonfat milk, and eat low-fat or nonfat yogurt and cheeses.  Do not eat or  drink whole milk, cream, ice cream, egg yolks, or hard cheeses.  Do not eat stick margarine or tub margarines that contain trans fats (also called partially hydrogenated oils).  Do not eat saturated tropical oils, such as coconut oil and palm oil.  Do not eat cakes, cookies, crackers, or other baked goods that contain trans  fats.  General instructions  Exercise as directed by your health care provider. Increase your activity level with activities such as gardening, walking, and taking the stairs.  Take over-the-counter and prescription medicines only as told by your health care provider.  Do not use any products that contain nicotine or tobacco, such as cigarettes and e-cigarettes. If you need help quitting, ask your health care provider.  Keep all follow-up visits as told by your health care provider. This is important. Contact a health care provider if:  You are struggling to maintain a healthy diet or weight.  You need help to start on an exercise program.  You need help to stop smoking. Get help right away if:  You have chest pain.  You have trouble breathing. This information is not intended to replace advice given to you by your health care provider. Make sure you discuss any questions you have with your health care provider. Document Released: 10/15/2005 Document Revised: 05/12/2016 Document Reviewed: 04/14/2016 Elsevier Interactive Patient Education  2018 [employer]   IF you received an x-ray today, you will receive an invoice from [facility]. Please contact [facility] at [phone] with questions or concerns regarding your invoice.   IF you received labwork today, you will receive an invoice from [facility]. Please contact [facility] at [phone] with questions or concerns regarding your invoice.   Our billing staff will not be able to assist you with questions regarding bills from these companies.  You will be contacted with the lab results as soon as they are available. The fastest way to get your results is to activate your My Chart account. Instructions are located on the last page of this paperwork. If you have not heard from us regarding the results in 2 weeks, please contact this office.

## 2017-12-19 LAB — CBC WITH DIFFERENTIAL/PLATELET
Basophils Absolute: 0 10*3/uL (ref 0.0–0.2)
Basos: 1 %
EOS (ABSOLUTE): 0.2 10*3/uL (ref 0.0–0.4)
EOS: 4 %
HEMATOCRIT: 40.4 % (ref 34.0–46.6)
HEMOGLOBIN: 13.6 g/dL (ref 11.1–15.9)
IMMATURE GRANULOCYTES: 0 %
Immature Grans (Abs): 0 10*3/uL (ref 0.0–0.1)
LYMPHS ABS: 1.3 10*3/uL (ref 0.7–3.1)
Lymphs: 32 %
MCH: 29.6 pg (ref 26.6–33.0)
MCHC: 33.7 g/dL (ref 31.5–35.7)
MCV: 88 fL (ref 79–97)
MONOCYTES: 7 %
Monocytes Absolute: 0.3 10*3/uL (ref 0.1–0.9)
NEUTROS PCT: 56 %
Neutrophils Absolute: 2.2 10*3/uL (ref 1.4–7.0)
Platelets: 265 10*3/uL (ref 150–379)
RBC: 4.6 x10E6/uL (ref 3.77–5.28)
RDW: 13.5 % (ref 12.3–15.4)
WBC: 3.9 10*3/uL (ref 3.4–10.8)

## 2017-12-19 LAB — LIPID PANEL
CHOL/HDL RATIO: 3.4 ratio (ref 0.0–4.4)
Cholesterol, Total: 227 mg/dL — ABNORMAL HIGH (ref 100–199)
HDL: 67 mg/dL (ref >39)
LDL CALC: 137 mg/dL — AB (ref 0–99)
TRIGLYCERIDES: 113 mg/dL (ref 0–149)
VLDL Cholesterol Cal: 23 mg/dL (ref 5–40)

## 2017-12-19 LAB — COMPREHENSIVE METABOLIC PANEL
ALBUMIN: 4.7 g/dL (ref 3.5–5.5)
ALT: 24 IU/L (ref 0–32)
AST: 16 IU/L (ref 0–40)
Albumin/Globulin Ratio: 1.6 (ref 1.2–2.2)
Alkaline Phosphatase: 101 IU/L (ref 39–117)
BUN / CREAT RATIO: 14 (ref 9–23)
BUN: 9 mg/dL (ref 6–24)
Bilirubin Total: 0.4 mg/dL (ref 0.0–1.2)
CO2: 23 mmol/L (ref 20–29)
CREATININE: 0.66 mg/dL (ref 0.57–1.00)
Calcium: 9.6 mg/dL (ref 8.7–10.2)
Chloride: 101 mmol/L (ref 96–106)
GFR calc Af Amer: 113 mL/min/{1.73_m2} (ref >59)
GFR, EST NON AFRICAN AMERICAN: 98 mL/min/{1.73_m2} (ref >59)
GLOBULIN, TOTAL: 2.9 g/dL (ref 1.5–4.5)
Glucose: 95 mg/dL (ref 65–99)
Potassium: 4 mmol/L (ref 3.5–5.2)
SODIUM: 143 mmol/L (ref 134–144)
Total Protein: 7.6 g/dL (ref 6.0–8.5)

## 2017-12-19 LAB — HEPATITIS C ANTIBODY

## 2017-12-19 LAB — HIV ANTIBODY (ROUTINE TESTING W REFLEX): HIV SCREEN 4TH GENERATION: NONREACTIVE

## 2018-01-01 ENCOUNTER — Telehealth: Payer: Self-pay | Admitting: Family Medicine

## 2018-01-01 NOTE — Telephone Encounter (Signed)
Called pt to try and reschedule her appt to a different day. When pt calls back, please reschedule her for a CPE for a different day at her convenience. Thanks!

## 2018-03-24 ENCOUNTER — Encounter: Payer: Self-pay | Admitting: Family Medicine

## 2018-06-11 ENCOUNTER — Encounter: Payer: BLUE CROSS/BLUE SHIELD | Admitting: Family Medicine

## 2018-06-17 ENCOUNTER — Encounter: Payer: Self-pay | Admitting: Family Medicine

## 2018-06-17 ENCOUNTER — Other Ambulatory Visit: Payer: Self-pay

## 2018-06-17 ENCOUNTER — Ambulatory Visit (INDEPENDENT_AMBULATORY_CARE_PROVIDER_SITE_OTHER): Payer: BLUE CROSS/BLUE SHIELD | Admitting: Family Medicine

## 2018-06-17 VITALS — BP 128/82 | HR 64 | Temp 98.3°F | Ht 59.45 in | Wt 120.2 lb

## 2018-06-17 DIAGNOSIS — E7800 Pure hypercholesterolemia, unspecified: Secondary | ICD-10-CM

## 2018-06-17 DIAGNOSIS — Z0000 Encounter for general adult medical examination without abnormal findings: Secondary | ICD-10-CM | POA: Diagnosis not present

## 2018-06-17 MED ORDER — SIMVASTATIN 20 MG PO TABS: 20.0000 mg | ORAL_TABLET | ORAL | 1 refills | Status: DC

## 2018-06-17 NOTE — Patient Instructions (Addendum)
Please ask your pharmacist for Shingrix (shingles vacine)     If you have lab work done today you will be contacted with your lab results within the next 2 weeks.  If you have not heard from [place] then please contact us. The fastest way to get your results is to register for My Chart.   IF you received an x-ray today, you will receive an invoice from [facility]. Please contact [facility] at [phone] with questions or concerns regarding your invoice.   IF you received labwork today, you will receive an invoice from [facility]. Please contact [facility] at [phone] with questions or concerns regarding your invoice.   Our billing staff will not be able to assist you with questions regarding bills from these companies.  You will be contacted with the lab results as soon as they are available. The fastest way to get your results is to activate your My Chart account. Instructions are located on the last page of this paperwork. If you have not heard from [place] regarding the results in 2 weeks, please contact this office.     Preventive Care 40-64 Years, Female Preventive care refers to lifestyle choices and visits with your health care provider that can promote health and wellness. What does preventive care include?  A yearly physical exam. This is also called an annual well check.  Dental exams once or twice a year.  Routine eye exams. Ask your health care provider how often you should have your eyes checked.  Personal lifestyle choices, including: ? Daily care of your teeth and gums. ? Regular physical activity. ? Eating a healthy diet. ? Avoiding tobacco and drug use. ? Limiting alcohol use. ? Practicing safe sex. ? Taking low-dose aspirin daily starting at age 69. ? Taking vitamin and mineral supplements as recommended by your health care provider. What happens during an annual well check? The services and screenings done by your health care provider during your  annual well check will depend on your age, overall health, lifestyle risk factors, and family history of disease. Counseling Your health care provider may ask you questions about your:  Alcohol use.  Tobacco use.  Drug use.  Emotional well-being.  Home and relationship well-being.  Sexual activity.  Eating habits.  Work and work Statistician.  Method of birth control.  Menstrual cycle.  Pregnancy history.  Screening You may have the following tests or measurements:  Height, weight, and BMI.  Blood pressure.  Lipid and cholesterol levels. These may be checked every 5 years, or more frequently if you are over 3 years old.  Skin check.  Lung cancer screening. You may have this screening every year starting at age 11 if you have a 30-pack-year history of smoking and currently smoke or have quit within the past 15 years.  Fecal occult blood test (FOBT) of the stool. You may have this test every year starting at age 66.  Flexible sigmoidoscopy or colonoscopy. You may have a sigmoidoscopy every 5 years or a colonoscopy every 10 years starting at age 41.  Hepatitis C blood test.  Hepatitis B blood test.  Sexually transmitted disease (STD) testing.  Diabetes screening. This is done by checking your blood sugar (glucose) after you have not eaten for a while (fasting). You may have this done every 1-3 years.  Mammogram. This may be done every 1-2 years. Talk to your health care provider about when you should start having regular mammograms. This may depend on whether you have a  family history of breast cancer.  BRCA-related cancer screening. This may be done if you have a family history of breast, ovarian, tubal, or peritoneal cancers.  Pelvic exam and Pap test. This may be done every 3 years starting at age 49. Starting at age 20, this may be done every 5 years if you have a Pap test in combination with an HPV test.  Bone density scan. This is done to screen for  osteoporosis. You may have this scan if you are at high risk for osteoporosis.  Discuss your test results, treatment options, and if necessary, the need for more tests with your health care provider. Vaccines Your health care provider may recommend certain vaccines, such as:  Influenza vaccine. This is recommended every year.  Tetanus, diphtheria, and acellular pertussis (Tdap, Td) vaccine. You may need a Td booster every 10 years.  Varicella vaccine. You may need this if you have not been vaccinated.  Zoster vaccine. You may need this after age 28.  Measles, mumps, and rubella (MMR) vaccine. You may need at least one dose of MMR if you were born in 1957 or later. You may also need a second dose.  Pneumococcal 13-valent conjugate (PCV13) vaccine. You may need this if you have certain conditions and were not previously vaccinated.  Pneumococcal polysaccharide (PPSV23) vaccine. You may need one or two doses if you smoke cigarettes or if you have certain conditions.  Meningococcal vaccine. You may need this if you have certain conditions.  Hepatitis A vaccine. You may need this if you have certain conditions or if you travel or work in places where you may be exposed to hepatitis A.  Hepatitis B vaccine. You may need this if you have certain conditions or if you travel or work in places where you may be exposed to hepatitis B.  Haemophilus influenzae type b (Hib) vaccine. You may need this if you have certain conditions.  Talk to your health care provider about which screenings and vaccines you need and how often you need them. This information is not intended to replace advice given to you by your health care provider. Make sure you discuss any questions you have with your health care provider. Document Released: 11/11/2015 Document Revised: 07/04/2016 Document Reviewed: 08/16/2015 Elsevier Interactive Patient Education  [employer].

## 2018-06-17 NOTE — Progress Notes (Signed)
8/20/201911:34 AM  Tracy Richard 12/23/1960, 57 y.o. female [id number]  Chief Complaint  Patient presents with  . Annual Exam    HPI:   Patient is a 57 y.o. female with past medical history significant for HLP who presents today for CPE  Last CPE 02/2017 Cervical Cancer Screening: 02/2017 - neg Breast Cancer Screening: 04/2017 - birads 0 on left due to distortion of image, does not want to repeat this year Colorectal Cancer Screening: 05/2017 - no polyps Bone Density Testing: n/a HIV Screening: 11/2017 Seasonal Influenza Vaccination: declines Td/Tdap Vaccination: 02/2017 Pneumococcal Vaccination: n/a Zoster Vaccination: will get at pharmacy Frequency of Dental evaluation: Q6 months, Dental Works Frequency of Eye evaluation: wears eyeglasses, needs to establish care  Exercises 3-4 times a week Has a healthy diet Takes simvastatin M/W/F Checks bp at home, 120-130/70-80s  Lab Results  Component Value Date   CHOL 227 (H) 12/18/2017   HDL 67 12/18/2017   LDLCALC 137 (H) 12/18/2017   TRIG 113 12/18/2017   CHOLHDL 3.4 12/18/2017    Fall Risk  06/17/2018 12/18/2017 12/05/2017 05/15/2017 03/27/2017  Falls in the past year? No No No No No     Depression screen [facility] 2[facility]9 06/17/2018 12/18/2017 12/05/2017  Decreased Interest 0 0 0  Down, Depressed, Hopeless 0 0 0  PHQ - 2 Score 0 0 0    Allergies  Allergen Reactions  . Eggs Or Egg-Derived Products Itching  . Pollen Extract     Prior to Admission medications   Medication Sig Start Date End Date Taking? Authorizing Provider  Coenzyme Q10 (COQ-10) 100 MG CAPS Take by mouth.   Yes [provider]  Emollient (COLLAGEN EX) Apply topically.   Yes [provider]  meloxicam (MOBIC) 7.5 MG tablet Take 1 tablet (7.5 mg total) by mouth daily. 12/05/17  Yes Myles LippsSantiago, Eman Morimoto M, MD  multivitamin-lutein [facility](OCUVITE-LUTEIN) CAPS capsule Take 1 capsule by mouth daily.   Yes [provider]  simvastatin (ZOCOR) 20 MG tablet TAKE  1 TABLET BY MOUTH EVERY DAY 11/06/17  Yes Ethelda ChickSmith, Kristi M, MD    Past Medical History:  Diagnosis Date  . Adie's pupil syndrome   . Allergy   . Arthritis   . BPV (benign positional vertigo)   . Hyperlipidemia     History reviewed. No pertinent surgical history.  Social History   Tobacco Use  . Smoking status: Never Smoker  . Smokeless tobacco: Never Used  Substance Use Topics  . Alcohol use: No    Family History  Problem Relation Age of Onset  . Alzheimer's disease Mother 5780       Alzheimer's dementia  . Hypertension Mother   . Cancer Father 1165       Lung cancer  . Hyperlipidemia Father   . Hypertension Father   . Hypertension Sister   . Hypertension Brother   . Hypertension Sister     Review of Systems  Constitutional: Negative for chills and fever.  Respiratory: Negative for cough and shortness of breath.   Cardiovascular: Negative for chest pain, palpitations and leg swelling.  Gastrointestinal: Negative for abdominal pain, blood in stool, constipation, diarrhea, melena, nausea and vomiting.  Genitourinary: Negative for dysuria and hematuria.       Neg breast lumps or nipple discharge Neg vaginal discharge, pelvic pain, dyspareunia, abnormal vaginal bleeding  Musculoskeletal: Positive for joint pain.  Neurological: Negative for dizziness, tingling, focal weakness and headaches.  Psychiatric/Behavioral: Negative for depression. The patient is not nervous/anxious and does not  have insomnia.   All other systems reviewed and are negative.    OBJECTIVE:  Blood pressure 128/82, pulse 64, temperature 98.3 F (36.8 C), temperature source Oral, height 4' 11.45" (1.51 m), weight 120 lb 3.2 oz (54.5 kg), last menstrual period 06/17/2018, SpO2 94 %. Body mass index is 23.91 kg/m.   Physical Exam  Constitutional: She is oriented to person, place, and time. She appears well-developed and well-nourished.  HENT:  Head: Normocephalic and atraumatic.  Right Ear: Hearing,  tympanic membrane, external ear and ear canal normal.  Left Ear: Hearing, tympanic membrane, external ear and ear canal normal.  Mouth/Throat: Oropharynx is clear and moist.  Eyes: Pupils are equal, round, and reactive to light. Conjunctivae and EOM are normal.  Neck: Neck supple. No thyromegaly present.  Cardiovascular: Normal rate, regular rhythm, normal heart sounds and intact distal pulses. Exam reveals no gallop and no friction rub.  No murmur heard. Pulmonary/Chest: Effort normal and breath sounds normal. She has no wheezes. She has no rales.  Abdominal: Soft. Bowel sounds are normal. She exhibits no distension and no mass. There is no tenderness.  Musculoskeletal: Normal range of motion. She exhibits no edema.  Lymphadenopathy:    She has no cervical adenopathy.  Neurological: She is alert and oriented to person, place, and time. She has normal reflexes. No cranial nerve deficit. Gait normal.  Skin: Skin is warm and dry.  Psychiatric: She has a normal mood and affect.  Nursing note and vitals reviewed.   ASSESSMENT and PLAN   1. Routine physical examination No concerns per history or exam. Routine HCM labs ordered. HCM reviewed/discussed. Anticipatory guidance regarding healthy weight, lifestyle and choices given.   2. Pure hypercholesterolemia Checking labs today, medications will be adjusted as needed.  - Lipid panel - TSH - Comprehensive metabolic panel    Other orders - simvastatin (ZOCOR) 20 MG tablet; Take 1 tablet (20 mg total) by mouth every other day.  Return in about 1 year (around 06/18/2019) for CPE.    Myles LippsIrma M Santiago, MD [facility] [address] [place], [place] [address] Ph.  [phone] Fax [phone]

## 2018-06-18 ENCOUNTER — Encounter: Payer: BLUE CROSS/BLUE SHIELD | Admitting: Family Medicine

## 2018-06-18 LAB — COMPREHENSIVE METABOLIC PANEL
ALT: 30 IU/L (ref 0–32)
AST: 27 IU/L (ref 0–40)
Albumin/Globulin Ratio: 1.8 (ref 1.2–2.2)
Albumin: 4.9 g/dL (ref 3.5–5.5)
Alkaline Phosphatase: 99 IU/L (ref 39–117)
BUN/Creatinine Ratio: 11 (ref 9–23)
BUN: 8 mg/dL (ref 6–24)
Bilirubin Total: 0.6 mg/dL (ref 0.0–1.2)
CO2: 23 mmol/L (ref 20–29)
Calcium: 9.7 mg/dL (ref 8.7–10.2)
Chloride: 101 mmol/L (ref 96–106)
Creatinine, Ser: 0.7 mg/dL (ref 0.57–1.00)
GFR calc Af Amer: 110 mL/min/{1.73_m2} (ref >59)
GFR calc non Af Amer: 95 mL/min/{1.73_m2} (ref >59)
Globulin, Total: 2.8 g/dL (ref 1.5–4.5)
Glucose: 102 mg/dL — ABNORMAL HIGH (ref 65–99)
Potassium: 4 mmol/L (ref 3.5–5.2)
Sodium: 141 mmol/L (ref 134–144)
Total Protein: 7.7 g/dL (ref 6.0–8.5)

## 2018-06-18 LAB — LIPID PANEL
Chol/HDL Ratio: 2.3 ratio (ref 0.0–4.4)
Cholesterol, Total: 170 mg/dL (ref 100–199)
HDL: 73 mg/dL (ref >39)
LDL Calculated: 82 mg/dL (ref 0–99)
Triglycerides: 76 mg/dL (ref 0–149)
VLDL Cholesterol Cal: 15 mg/dL (ref 5–40)

## 2018-06-18 LAB — TSH: TSH: 1.45 u[IU]/mL (ref 0.450–4.500)

## 2018-12-16 ENCOUNTER — Other Ambulatory Visit: Payer: Self-pay | Admitting: Family Medicine

## 2018-12-16 NOTE — Telephone Encounter (Signed)
Current prescription expired refilled until next appt In 6 months. Requested Prescriptions  Pending Prescriptions Disp Refills  . simvastatin (ZOCOR) 20 MG tablet [Pharmacy Med Name: SIMVASTATIN 20 MG TABLET] 45 tablet 1    Sig: TAKE 1 TABLET BY MOUTH EVERY OTHER DAY     Cardiovascular:  Antilipid - Statins Passed - 12/16/2018 11:27 AM      Passed - Total Cholesterol in normal range and within 360 days    Cholesterol, Total  Date Value Ref Range Status  06/17/2018 170 100 - 199 mg/dL Final         Passed - LDL in normal range and within 360 days    LDL Calculated  Date Value Ref Range Status  06/17/2018 82 0 - 99 mg/dL Final         Passed - HDL in normal range and within 360 days    HDL  Date Value Ref Range Status  06/17/2018 73 >39 mg/dL Final         Passed - Triglycerides in normal range and within 360 days    Triglycerides  Date Value Ref Range Status  06/17/2018 76 0 - 149 mg/dL Final         Passed - Patient is not pregnant      Passed - Valid encounter within last 12 months    Recent Outpatient Visits          6 months ago Routine physical examination   [facility], Meda Coffee, MD   12 months ago Pure hypercholesterolemia   [facility] at [facility], Myrle Sheng, MD   1 year ago Pain and swelling of left shoulder   [facility], Meda Coffee, MD   1 year ago Pure hypercholesterolemia   Primary Care at [facility], Myrle Sheng, MD   1 year ago Routine physical examination   [facility] at [facility], Myrle Sheng, MD      Future Appointments            In 6 months Myles Lipps, MD Primary Care at [place], [facility]

## 2019-06-11 ENCOUNTER — Other Ambulatory Visit: Payer: Self-pay | Admitting: Family Medicine

## 2019-06-21 IMAGING — MG 2D DIGITAL SCREENING BILATERAL MAMMOGRAM WITH CAD AND ADJUNCT TO
6 of 10 series · 6 of 26 positions shown · non-contrast
Comparison: None

CLINICAL DATA: Screening.

EXAM:
2D DIGITAL SCREENING BILATERAL MAMMOGRAM WITH CAD AND ADJUNCT TOMO

[L XCCL]
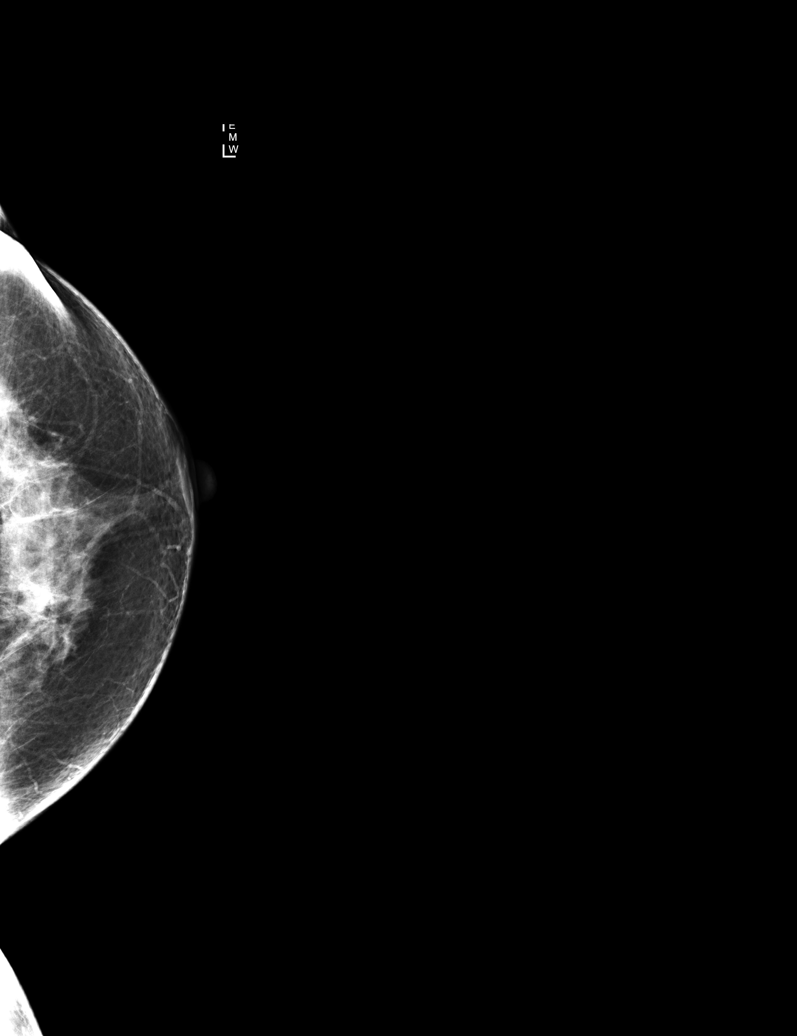

[R XCCL]
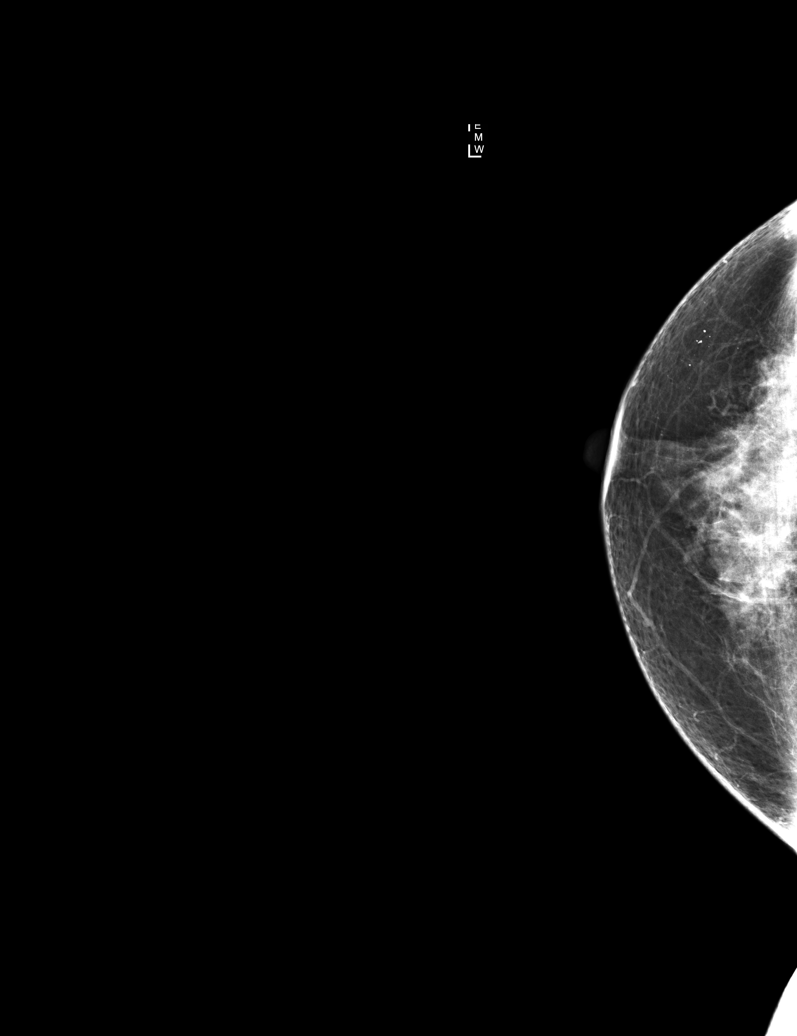

[L MLO]
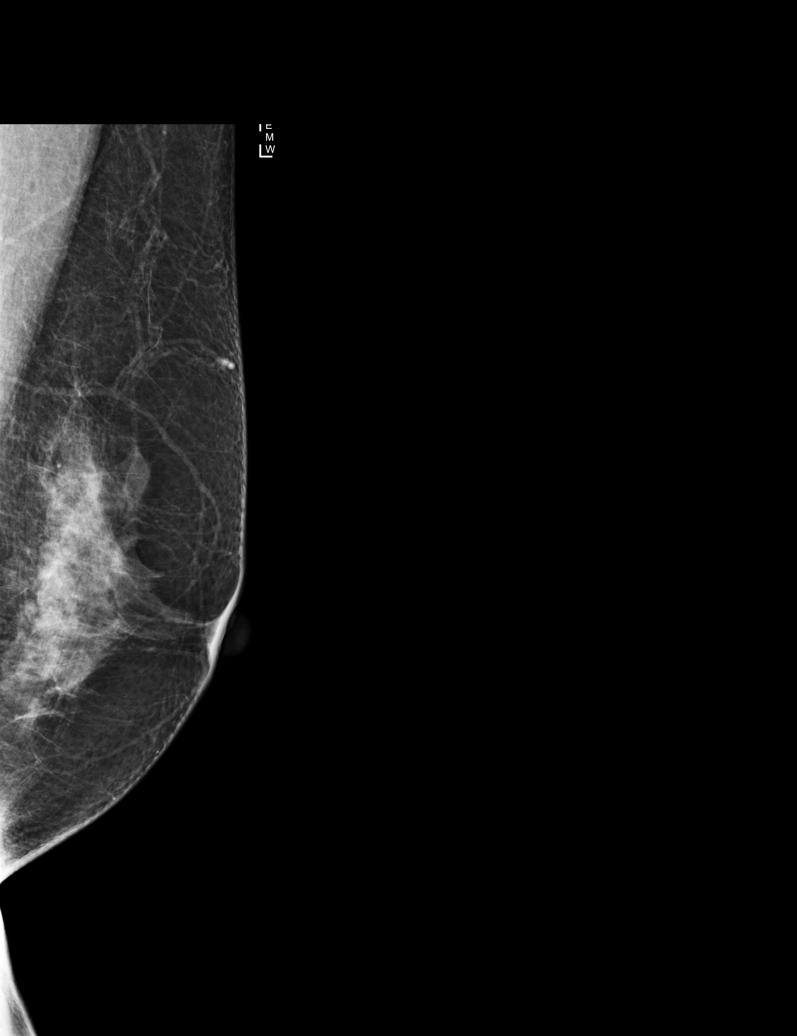

[L CC]
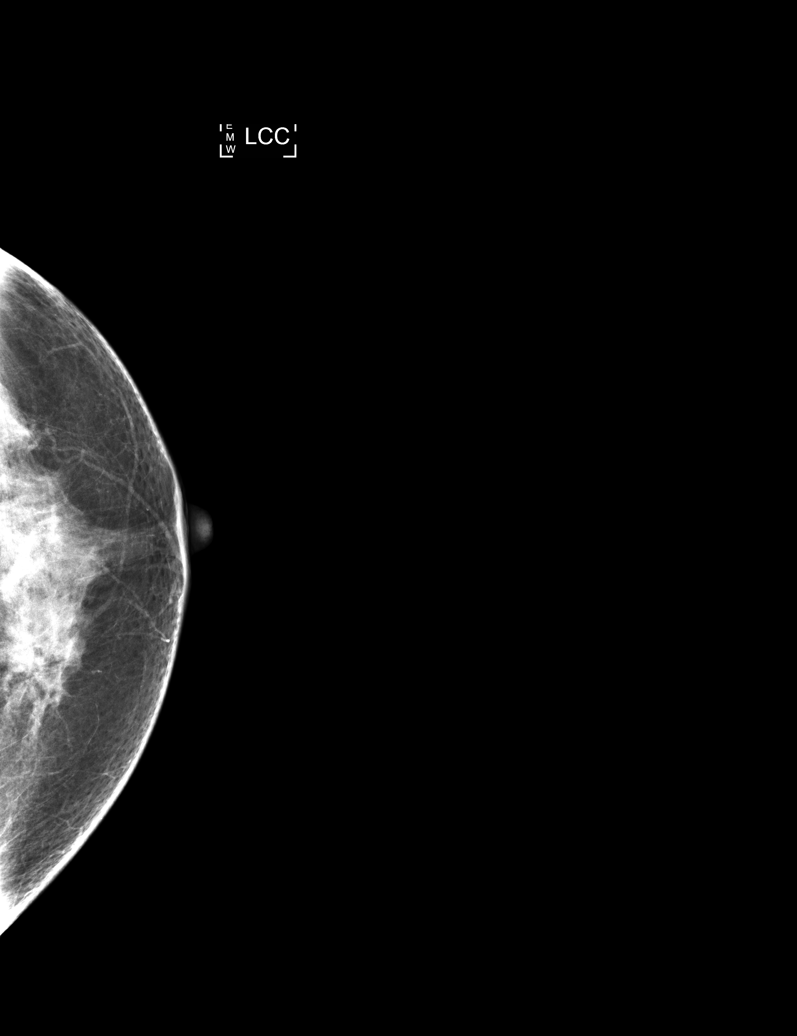

[R MLO]
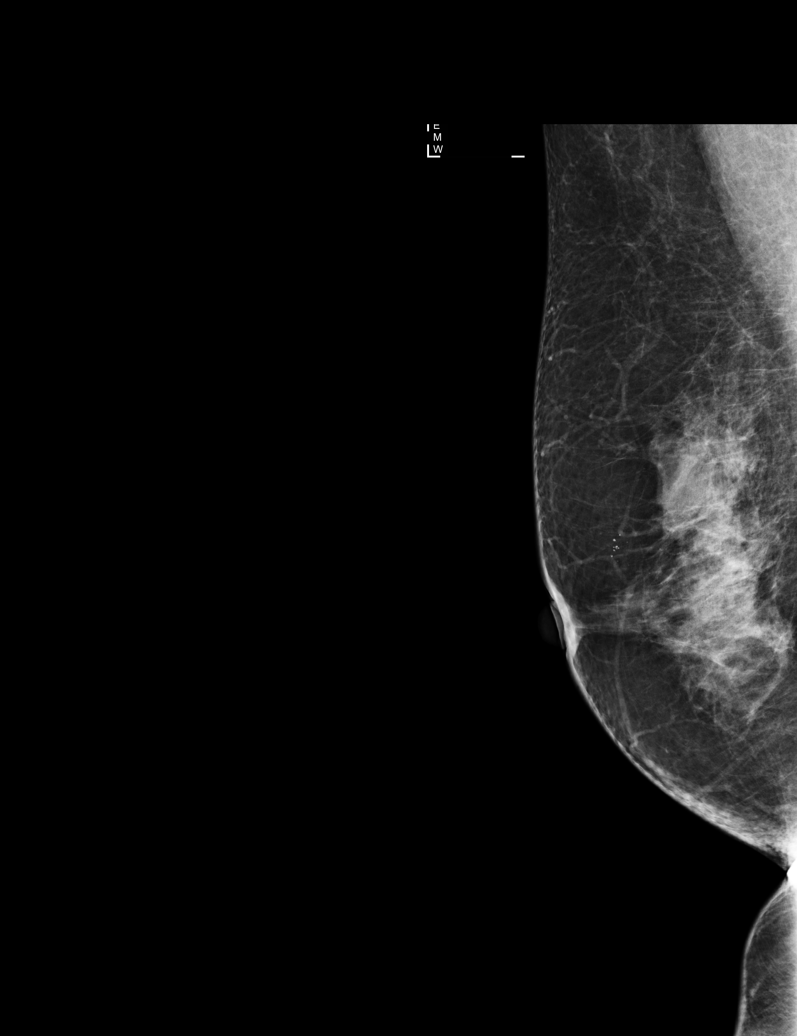

[R CC]
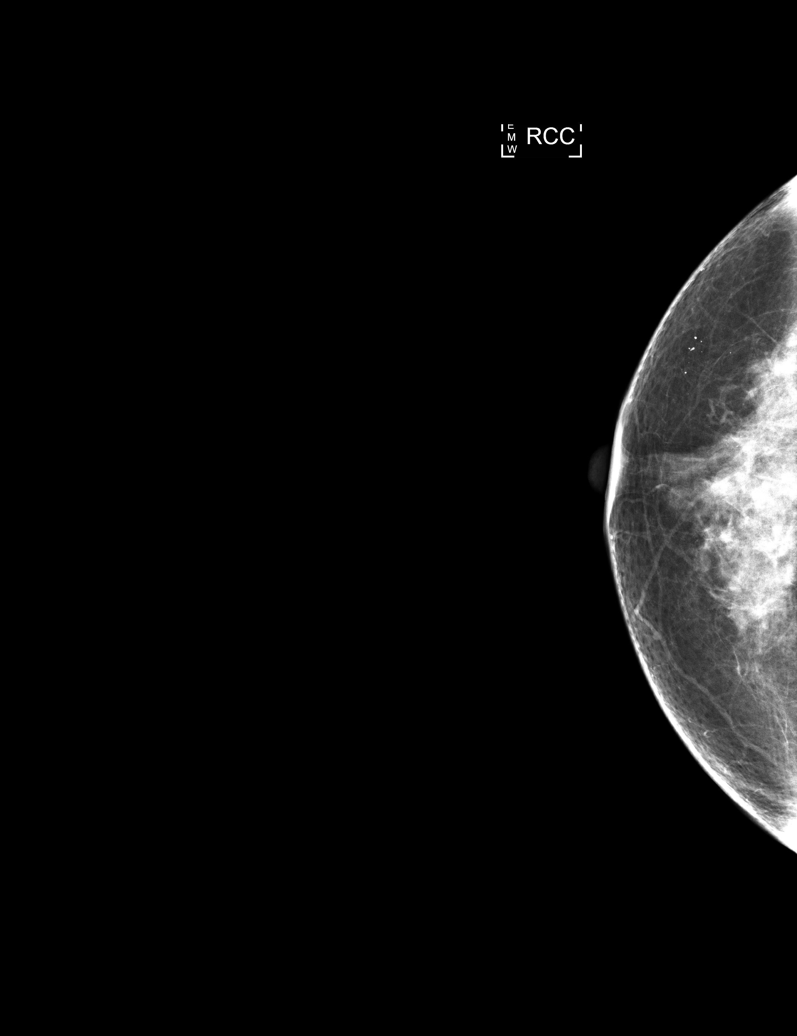

[6 of 26 positions shown; findings below may reference images not displayed]

ACR Breast Density Category c: The breast tissue is heterogeneously
dense, which may obscure small masses.
FINDINGS: In the left breast, possible distortion warrants further evaluation.
In the right breast, no findings suspicious for malignancy. Images
were processed with CAD.
IMPRESSION: Further evaluation is suggested for possible distortion in the left
breast.

RECOMMENDATION:
Diagnostic mammogram and possibly ultrasound of the left breast.
(Code:[id number])

The patient will be contacted regarding the findings, and additional
imaging will be scheduled.

BI-RADS CATEGORY  0: Incomplete. Need additional imaging evaluation
and/or prior mammograms for comparison.

## 2019-06-23 ENCOUNTER — Encounter: Payer: BLUE CROSS/BLUE SHIELD | Admitting: Family Medicine

## 2019-09-01 ENCOUNTER — Other Ambulatory Visit: Payer: Self-pay | Admitting: Family Medicine

## 2019-09-03 ENCOUNTER — Encounter: Payer: Self-pay | Admitting: Family Medicine

## 2019-12-03 ENCOUNTER — Other Ambulatory Visit: Payer: Self-pay | Admitting: Family Medicine

## 2019-12-30 ENCOUNTER — Other Ambulatory Visit: Payer: Self-pay | Admitting: Family Medicine

## 2019-12-30 NOTE — Telephone Encounter (Signed)
Scheduled pt. For OV this month. Refilled enough medication until appointment.

## 2020-01-19 ENCOUNTER — Ambulatory Visit: Payer: BLUE CROSS/BLUE SHIELD | Admitting: Family Medicine

## 2020-02-03 ENCOUNTER — Ambulatory Visit (INDEPENDENT_AMBULATORY_CARE_PROVIDER_SITE_OTHER): Payer: BC Managed Care – PPO | Admitting: Family Medicine

## 2020-02-03 ENCOUNTER — Encounter: Payer: Self-pay | Admitting: Family Medicine

## 2020-02-03 ENCOUNTER — Other Ambulatory Visit: Payer: Self-pay

## 2020-02-03 VITALS — BP 148/82 | HR 99 | Temp 98.7°F | Ht 59.0 in | Wt 126.0 lb

## 2020-02-03 DIAGNOSIS — M25511 Pain in right shoulder: Secondary | ICD-10-CM

## 2020-02-03 DIAGNOSIS — R030 Elevated blood-pressure reading, without diagnosis of hypertension: Secondary | ICD-10-CM

## 2020-02-03 DIAGNOSIS — M25512 Pain in left shoulder: Secondary | ICD-10-CM | POA: Diagnosis not present

## 2020-02-03 DIAGNOSIS — E7800 Pure hypercholesterolemia, unspecified: Secondary | ICD-10-CM | POA: Diagnosis not present

## 2020-02-03 MED ORDER — SIMVASTATIN 20 MG PO TABS: 20.0000 mg | ORAL_TABLET | ORAL | 3 refills | Status: DC

## 2020-02-03 MED ORDER — MELOXICAM 7.5 MG PO TABS: 7.5000 mg | ORAL_TABLET | Freq: Every day | ORAL | 2 refills | Status: DC

## 2020-02-03 NOTE — Patient Instructions (Addendum)
COVID-19 Vaccine Information can be found at: ShippingScam.co.uk For questions related to vaccine distribution or appointments, please email vaccine@ [email or web address] or call [phone].   Bring BP cuff when you come in for your physical exam    Preventing Hypertension Hypertension, commonly called high blood pressure, is when the force of blood pumping through the arteries is too strong. Arteries are blood vessels that carry blood from the heart throughout the body. Over time, hypertension can damage the arteries and decrease blood flow to important parts of the body, including the brain, heart, and kidneys. Often, hypertension does not cause symptoms until blood pressure is very high. For this reason, it is important to have your blood pressure checked on a regular basis. Hypertension can often be prevented with diet and lifestyle changes. If you already have hypertension, you can control it with diet and lifestyle changes, as well as medicine. What nutrition changes can be made? Maintain a healthy diet. This includes:  Eating less salt (sodium). Ask your health care provider how much sodium is safe for you to have. The general recommendation is to consume less than 1 tsp (2,300 mg) of sodium a day. ? Do not add salt to your food. ? Choose low-sodium options when grocery shopping and eating out.  Limiting fats in your diet. You can do this by eating low-fat or fat-free dairy products and by eating less red meat.  Eating more fruits, vegetables, and whole grains. Make a goal to eat: ? 1-2 cups of fresh fruits and vegetables each day. ? 3-4 servings of whole grains each day.  Avoiding foods and beverages that have added sugars.  Eating fish that contain healthy fats (omega-3 fatty acids), such as mackerel or salmon. If you need help putting together a healthy eating plan, try the DASH diet. This diet is high in fruits, vegetables,  and whole grains. It is low in sodium, red meat, and added sugars. DASH stands for Dietary Approaches to Stop Hypertension. What lifestyle changes can be made?   Lose weight if you are overweight. Losing just 3?5% of your body weight can help prevent or control hypertension. ? For example, if your present weight is 200 lb (91 kg), a loss of 3-5% of your weight means losing 6-10 lb (2.7-4.5 kg). ? Ask your health care provider to help you with a diet and exercise plan to safely lose weight.  Get enough exercise. Do at least 150 minutes of moderate-intensity exercise each week. ? You could do this in short exercise sessions several times a day, or you could do longer exercise sessions a few times a week. For example, you could take a brisk 10-minute walk or bike ride, 3 times a day, for 5 days a week.  Find ways to reduce stress, such as exercising, meditating, listening to music, or taking a yoga class. If you need help reducing stress, ask your health care provider.  Do not smoke. This includes e-cigarettes. Chemicals in tobacco and nicotine products raise your blood pressure each time you smoke. If you need help quitting, ask your health care provider.  Avoid alcohol. If you drink alcohol, limit alcohol intake to no more than 1 drink a day for nonpregnant women and 2 drinks a day for men. One drink equals 12 oz of beer, 5 oz of wine, or 1 oz of hard liquor. Why are these changes important? Diet and lifestyle changes can help you prevent hypertension, and they may make you feel better overall and improve your quality of  life. If you have hypertension, making these changes will help you control it and help prevent major complications, such as:  Hardening and narrowing of arteries that supply blood to: ? Your heart. This can cause a heart attack. ? Your brain. This can cause a stroke. ? Your kidneys. This can cause kidney failure.  Stress on your heart muscle, which can cause heart  failure. What can I do to lower my risk?  Work with your health care provider to make a hypertension prevention plan that works for you. Follow your plan and keep all follow-up visits as told by your health care provider.  Learn how to check your blood pressure at home. Make sure that you know your personal target blood pressure, as told by your health care provider. How is this treated? In addition to diet and lifestyle changes, your health care provider may recommend medicines to help lower your blood pressure. You may need to try a few different medicines to find what works best for you. You also may need to take more than one medicine. Take over-the-counter and prescription medicines only as told by your health care provider. Where to find support Your health care provider can help you prevent hypertension and help you keep your blood pressure at a healthy level. Your local hospital or your community may also provide support services and prevention programs. The American Heart Association offers an online support network at: https://www.lee.net/ Where to find more information Learn more about hypertension from:  National Heart, Lung, and Blood Institute: https://www.peterson.org/  Centers for Disease Control and Prevention: AboutHD.co.nz  American Academy of Family Physicians: http://familydoctor.org/familydoctor/en/diseases-conditions/high-blood-pressure.printerview.all.html Learn more about the DASH diet from:  National Heart, Lung, and Blood Institute: WedMap.it Contact a health care provider if:  You think you are having a reaction to medicines you have taken.  You have recurrent headaches or feel dizzy.  You have swelling in your ankles.  You have trouble with your vision. Summary  Hypertension often does not cause any symptoms until blood pressure is very high. It is  important to get your blood pressure checked regularly.  Diet and lifestyle changes are the most important steps in preventing hypertension.  By keeping your blood pressure in a healthy range, you can prevent complications like heart attack, heart failure, stroke, and kidney failure.  Work with your health care provider to make a hypertension prevention plan that works for you. This information is not intended to replace advice given to you by your health care provider. Make sure you discuss any questions you have with your health care provider. Document Revised: 02/06/2019 Document Reviewed: 06/25/2016 Elsevier Patient Education  [employer].   If you have lab work done today you will be contacted with your lab results within the next 2 weeks.  If you have not heard from [place] then please contact us. The fastest way to get your results is to register for My Chart.   IF you received an x-ray today, you will receive an invoice from [facility]. Please contact [facility] at [phone] with questions or concerns regarding your invoice.   IF you received labwork today, you will receive an invoice from [facility]. Please contact [facility] at [phone] with questions or concerns regarding your invoice.   Our billing staff will not be able to assist you with questions regarding bills from these companies.  You will be contacted with the lab results as soon as they are available. The fastest way to get your results is  to activate your My Chart account. Instructions are located on the last page of this paperwork. If you have not heard from us regarding the results in 2 weeks, please contact this office.      

## 2020-02-03 NOTE — Progress Notes (Signed)
4/7/202110:40 AM  Tracy Richard 1961/04/24, 59 y.o., female [id number]  Chief Complaint  Patient presents with  . Medication Management    med refills  . Hypertension    HPI:   Patient is a 59 y.o. female with past medical history significant for HLP who presents today for medication refill  Last OV 2019 Patient reports white coat syndrome, monitors BP at home Brings in readings: 132/78, 136/79, 135/81  Takes simvastatin daily Uses meloxicam prn for shoulder pain Has been less active during covid  Lab Results  Component Value Date   CHOL 170 06/17/2018   HDL 73 06/17/2018   LDLCALC 82 06/17/2018   TRIG 76 06/17/2018   CHOLHDL 2.3 06/17/2018    Health Maintenance Due  Topic Date Due  . MAMMOGRAM  05/03/2019  . PAP SMEAR-Modifier  03/27/2020   Depression screen [facility] 2/9 02/03/2020 06/17/2018 12/18/2017  Decreased Interest 0 0 0  Down, Depressed, Hopeless 0 0 0  PHQ - 2 Score 0 0 0    Fall Risk  02/03/2020 06/17/2018 12/18/2017 12/05/2017 05/15/2017  Falls in the past year? 0 No No No No  Number falls in past yr: 0 - - - -  Injury with Fall? 0 - - - -  Follow up Falls evaluation completed - - - -     Allergies  Allergen Reactions  . Eggs Or Egg-Derived Products Itching  . Pollen Extract     Prior to Admission medications   Medication Sig Start Date End Date Taking? Authorizing Provider  cholecalciferol (VITAMIN D3) 25 MCG (1000 UNIT) tablet Take 1,000 Units by mouth daily.   Yes [provider]  Coenzyme Q10 (COQ-10) 100 MG CAPS Take by mouth.   Yes [provider]  multivitamin-lutein (OCUVITE-LUTEIN) CAPS capsule Take 1 capsule by mouth daily.   Yes [provider]  simvastatin (ZOCOR) 20 MG tablet TAKE 1 TABLET BY MOUTH EVERY OTHER DAY 12/30/19  Yes Rutherford Guys, MD  Emollient (COLLAGEN EX) Apply topically.    [provider]  meloxicam (MOBIC) 7.5 MG tablet Take 1 tablet (7.5 mg total) by mouth daily. Patient not taking:  Reported on 02/03/2020 12/05/17   Rutherford Guys, MD    Past Medical History:  Diagnosis Date  . Adie's pupil syndrome   . Allergy   . Arthritis   . BPV (benign positional vertigo)   . Hyperlipidemia     No past surgical history on file.  Social History   Tobacco Use  . Smoking status: Never Smoker  . Smokeless tobacco: Never Used  Substance Use Topics  . Alcohol use: No    Family History  Problem Relation Age of Onset  . Alzheimer's disease Mother 16       Alzheimer's dementia  . Hypertension Mother   . Cancer Father 58       Lung cancer  . Hyperlipidemia Father   . Hypertension Father   . Hypertension Sister   . Hypertension Brother   . Hypertension Sister     Review of Systems  Constitutional: Negative for chills and fever.  Respiratory: Negative for cough and shortness of breath.   Cardiovascular: Negative for chest pain, palpitations and leg swelling.  Gastrointestinal: Negative for abdominal pain, nausea and vomiting.     OBJECTIVE:  Today's Vitals   02/03/20 1029 02/03/20 1042  BP: (!) 186/103 (!) 148/82  Pulse: 99   Temp: 98.7 F (37.1 C)   TempSrc: Temporal   SpO2: 95%  Weight: 126 lb (57.2 kg)   Height: 4' 11"  (1.499 m)    Body mass index is 25.45 kg/m.  Wt Readings from Last 3 Encounters:  02/03/20 126 lb (57.2 kg)  06/17/18 120 lb 3.2 oz (54.5 kg)  12/18/17 118 lb (53.5 kg)    Physical Exam Vitals and nursing note reviewed.  Constitutional:      Appearance: She is well-developed.  HENT:     Head: Normocephalic and atraumatic.     Mouth/Throat:     Pharynx: No oropharyngeal exudate.  Eyes:     General: No scleral icterus.    Conjunctiva/sclera: Conjunctivae normal.     Pupils: Pupils are equal, round, and reactive to light.  Cardiovascular:     Rate and Rhythm: Normal rate and regular rhythm.     Heart sounds: Normal heart sounds. No murmur. No friction rub. No gallop.   Pulmonary:     Effort: Pulmonary effort is normal.      Breath sounds: Normal breath sounds. No wheezing or rales.  Musculoskeletal:     Cervical back: Neck supple.  Skin:    General: Skin is warm and dry.  Neurological:     Mental Status: She is alert and oriented to person, place, and time.     No results found for this or any previous visit (from the past 24 hour(s)).  No results found.   ASSESSMENT and PLAN  1. Pure hypercholesterolemia Checking labs today, medications will be adjusted as needed. Discussed LFM - CMP14+EGFR - Lipid panel  2. Pain of both shoulder joints Stable. Continue with meloxicam prn and regular exercises.  3. Elevated blood pressure reading Discussed LFM. Bring in BP cuff at next OV.   Other orders - cholecalciferol (VITAMIN D3) 25 MCG (1000 UNIT) tablet; Take 1,000 Units by mouth daily. - meloxicam (MOBIC) 7.5 MG tablet; Take 1 tablet (7.5 mg total) by mouth daily. - simvastatin (ZOCOR) 20 MG tablet; Take 1 tablet (20 mg total) by mouth every other day.  Return in about 3 months (around 05/04/2020) for CPE with pap.    Rutherford Guys, MD Primary Care at [facility], [place] [address] Ph.  [phone] Fax [phone]

## 2020-02-04 LAB — CMP14+EGFR
ALT: 27 IU/L (ref 0–32)
AST: 25 IU/L (ref 0–40)
Albumin/Globulin Ratio: 1.6 (ref 1.2–2.2)
Albumin: 4.9 g/dL (ref 3.8–4.9)
Alkaline Phosphatase: 100 IU/L (ref 39–117)
BUN/Creatinine Ratio: 15 (ref 12–28)
BUN: 11 mg/dL (ref 8–27)
Bilirubin Total: 0.5 mg/dL (ref 0.0–1.2)
CO2: 24 mmol/L (ref 20–29)
Calcium: 9.9 mg/dL (ref 8.7–10.3)
Chloride: 102 mmol/L (ref 96–106)
Creatinine, Ser: 0.71 mg/dL (ref 0.57–1.00)
GFR calc Af Amer: 107 mL/min/{1.73_m2} (ref >59)
GFR calc non Af Amer: 93 mL/min/{1.73_m2} (ref >59)
Globulin, Total: 3 g/dL (ref 1.5–4.5)
Glucose: 103 mg/dL — ABNORMAL HIGH (ref 65–99)
Potassium: 3.9 mmol/L (ref 3.5–5.2)
Sodium: 140 mmol/L (ref 134–144)
Total Protein: 7.9 g/dL (ref 6.0–8.5)

## 2020-02-04 LAB — LIPID PANEL
Chol/HDL Ratio: 2.2 ratio (ref 0.0–4.4)
Cholesterol, Total: 157 mg/dL (ref 100–199)
HDL: 72 mg/dL (ref >39)
LDL Chol Calc (NIH): 67 mg/dL (ref 0–99)
Triglycerides: 101 mg/dL (ref 0–149)
VLDL Cholesterol Cal: 18 mg/dL (ref 5–40)

## 2020-02-21 ENCOUNTER — Other Ambulatory Visit: Payer: Self-pay | Admitting: Family Medicine

## 2020-05-05 ENCOUNTER — Encounter: Payer: BC Managed Care – PPO | Admitting: Family Medicine

## 2020-06-03 ENCOUNTER — Encounter: Payer: BC Managed Care – PPO | Admitting: Family Medicine

## 2020-07-25 ENCOUNTER — Other Ambulatory Visit: Payer: Self-pay

## 2020-07-25 ENCOUNTER — Other Ambulatory Visit: Payer: Self-pay | Admitting: Family Medicine

## 2020-07-25 ENCOUNTER — Ambulatory Visit
Admission: RE | Admit: 2020-07-25 | Discharge: 2020-07-25 | Disposition: A | Discharge status: Home / Self Care | Payer: BC Managed Care – PPO | Source: Ambulatory Visit | Attending: Family Medicine | Admitting: Family Medicine

## 2020-07-25 DIAGNOSIS — Z1231 Encounter for screening mammogram for malignant neoplasm of breast: Secondary | ICD-10-CM | POA: Diagnosis not present

## 2020-07-28 ENCOUNTER — Encounter: Payer: Self-pay | Admitting: Family Medicine

## 2020-07-28 ENCOUNTER — Other Ambulatory Visit: Payer: Self-pay

## 2020-07-28 ENCOUNTER — Ambulatory Visit: Payer: BC Managed Care – PPO | Admitting: Family Medicine

## 2020-07-28 ENCOUNTER — Ambulatory Visit (INDEPENDENT_AMBULATORY_CARE_PROVIDER_SITE_OTHER): Payer: BC Managed Care – PPO | Admitting: Family Medicine

## 2020-07-28 VITALS — BP 158/84 | HR 88 | Temp 98.6°F | Ht 59.0 in | Wt 125.0 lb

## 2020-07-28 DIAGNOSIS — R030 Elevated blood-pressure reading, without diagnosis of hypertension: Secondary | ICD-10-CM

## 2020-07-28 DIAGNOSIS — Z0001 Encounter for general adult medical examination with abnormal findings: Secondary | ICD-10-CM

## 2020-07-28 DIAGNOSIS — E7800 Pure hypercholesterolemia, unspecified: Secondary | ICD-10-CM

## 2020-07-28 DIAGNOSIS — Z0000 Encounter for general adult medical examination without abnormal findings: Secondary | ICD-10-CM

## 2020-07-28 MED ORDER — SIMVASTATIN 20 MG PO TABS: 20.0000 mg | ORAL_TABLET | ORAL | 3 refills | Status: AC

## 2020-07-28 NOTE — Progress Notes (Signed)
9/30/202110:45 AM  Tracy Richard 01/23/1961, 59 y.o., female [id number]  Chief Complaint  Patient presents with  . Annual Exam    HPI:   Patient is a 59 y.o. female with past medical history significant for HLP who presents today for CPE  Last CPE 2019 Last pap May 2018 - WNL, neg HPV, denies any h/o of abnormal paps Last mammo sept 2021 - normal Health Maintenance  Topic Date Due  . INFLUENZA VACCINE  01/26/2021 (Originally 05/29/2020)  . PAP SMEAR-Modifier  03/27/2022  . MAMMOGRAM  07/25/2022  . TETANUS/TDAP  03/28/2027  . COLONOSCOPY  06/26/2027  . COVID-19 Vaccine  Completed  . Hepatitis C Screening  Completed  . HIV Screening  Completed   She checks BP at home 2-3 x week: 120/70s She reports that she gets nervous when she comes into the doctors office Declines flu vaccine after having severe reaction in the past Needs to schedule eye and dentist appts walks 25 minutes 3 x week Monitors her portions, tries to eat healthy Non fasting   Wt Readings from Last 3 Encounters:  07/28/20 125 lb (56.7 kg)  02/03/20 126 lb (57.2 kg)  06/17/18 120 lb 3.2 oz (54.5 kg)   BP Readings from Last 3 Encounters:  07/28/20 (!) 188/94  02/03/20 (!) 148/82  06/17/18 128/82   Lab Results  Component Value Date   CHOL 157 02/03/2020   HDL 72 02/03/2020   LDLCALC 67 02/03/2020   TRIG 101 02/03/2020   CHOLHDL 2.2 02/03/2020   Lab Results  Component Value Date   CREATININE 0.71 02/03/2020   BUN 11 02/03/2020   NA 140 02/03/2020   K 3.9 02/03/2020   CL 102 02/03/2020   CO2 24 02/03/2020   Lab Results  Component Value Date   ALT 27 02/03/2020   AST 25 02/03/2020   ALKPHOS 100 02/03/2020   BILITOT 0.5 02/03/2020   There are no preventive care reminders to display for this patient.  Depression screen [facility] 2/9 02/03/2020 06/17/2018 12/18/2017  Decreased Interest 0 0 0  Down, Depressed, Hopeless 0 0 0  PHQ - 2 Score 0 0 0    Fall Risk  02/03/2020 06/17/2018 12/18/2017  12/05/2017 05/15/2017  Falls in the past year? 0 No No No No  Number falls in past yr: 0 - - - -  Injury with Fall? 0 - - - -  Follow up Falls evaluation completed - - - -     Allergies  Allergen Reactions  . Eggs Or Egg-Derived Products Itching  . Pollen Extract     Prior to Admission medications   Medication Sig Start Date End Date Taking? Authorizing Provider  cholecalciferol (VITAMIN D3) 25 MCG (1000 UNIT) tablet Take 1,000 Units by mouth daily.   Yes [provider]  Coenzyme Q10 (COQ-10) 100 MG CAPS Take by mouth.   Yes [provider]  multivitamin-lutein (OCUVITE-LUTEIN) CAPS capsule Take 1 capsule by mouth daily.   Yes [provider]  simvastatin (ZOCOR) 20 MG tablet Take 1 tablet (20 mg total) by mouth every other day. 02/03/20  Yes Myles Lipps, MD  meloxicam (MOBIC) 7.5 MG tablet Take 1 tablet (7.5 mg total) by mouth daily. Patient not taking: Reported on 07/28/2020 02/03/20   Myles Lipps, MD    Past Medical History:  Diagnosis Date  . Adie's pupil syndrome   . Allergy   . Arthritis   . BPV (benign positional vertigo)   . Hyperlipidemia  No past surgical history on file.  Social History   Tobacco Use  . Smoking status: Never Smoker  . Smokeless tobacco: Never Used  Substance Use Topics  . Alcohol use: No    Family History  Problem Relation Age of Onset  . Alzheimer's disease Mother 77       Alzheimer's dementia  . Hypertension Mother   . Cancer Father 25       Lung cancer  . Hyperlipidemia Father   . Hypertension Father   . Hypertension Sister   . Hypertension Brother   . Hypertension Sister     Review of Systems  Constitutional: Negative for chills and fever.  HENT: Negative for hearing loss, sore throat and tinnitus.   Eyes: Negative for blurred vision and double vision.  Respiratory: Negative for cough and shortness of breath.   Cardiovascular: Negative for chest pain, palpitations and leg swelling.   Gastrointestinal: Negative for abdominal pain, blood in stool, constipation, diarrhea, melena, nausea and vomiting.  Genitourinary: Negative for dysuria and hematuria.  Musculoskeletal: Negative for joint pain and myalgias.  Neurological: Negative for dizziness, tingling, focal weakness and headaches.  Endo/Heme/Allergies: Negative for polydipsia.  Psychiatric/Behavioral: Negative for depression. The patient is not nervous/anxious and does not have insomnia.      OBJECTIVE:  Today's Vitals   07/28/20 1023 07/28/20 1055  BP: (!) 188/94 (!) 158/84  Pulse: 88   Temp: 98.6 F (37 C)   SpO2: 98%   Weight: 125 lb (56.7 kg)   Height: 4\' 11"  (1.499 m)    Body mass index is 25.25 kg/m.   Physical Exam Vitals and nursing note reviewed. Exam conducted with a chaperone present.  Constitutional:      Appearance: She is well-developed.  HENT:     Head: Normocephalic and atraumatic.     Right Ear: Hearing, tympanic membrane, ear canal and external ear normal.     Left Ear: Hearing, tympanic membrane, ear canal and external ear normal.     Mouth/Throat:     Mouth: Mucous membranes are moist.     Pharynx: No oropharyngeal exudate or posterior oropharyngeal erythema.  Eyes:     Extraocular Movements: Extraocular movements intact.     Conjunctiva/sclera: Conjunctivae normal.     Pupils: Pupils are equal, round, and reactive to light.  Neck:     Thyroid: No thyromegaly.  Cardiovascular:     Rate and Rhythm: Normal rate and regular rhythm.     Heart sounds: Normal heart sounds. No murmur heard.  No friction rub. No gallop.   Pulmonary:     Effort: Pulmonary effort is normal.     Breath sounds: Normal breath sounds. No wheezing, rhonchi or rales.  Chest:     Breasts:        Right: No mass, nipple discharge or skin change.        Left: No mass, nipple discharge or skin change.  Abdominal:     General: Bowel sounds are normal. There is no distension.     Palpations: Abdomen is soft.  There is no hepatomegaly, splenomegaly or mass.     Tenderness: There is no abdominal tenderness.  Musculoskeletal:        General: Normal range of motion.     Cervical back: Neck supple.     Right lower leg: No edema.     Left lower leg: No edema.  Lymphadenopathy:     Cervical: No cervical adenopathy.     Upper Body:  Right upper body: No supraclavicular, axillary or pectoral adenopathy.     Left upper body: No supraclavicular, axillary or pectoral adenopathy.  Skin:    General: Skin is warm and dry.  Neurological:     Mental Status: She is alert and oriented to person, place, and time.     Cranial Nerves: No cranial nerve deficit.     Gait: Gait normal.     Deep Tendon Reflexes: Reflexes are normal and symmetric.  Psychiatric:        Mood and Affect: Mood normal.        Behavior: Behavior normal.     No results found for this or any previous visit (from the past 24 hour(s)).  No results found.   ASSESSMENT and PLAN  1. Annual physical exam No concerns per history or exam. Routine HCM labs ordered. HCM reviewed/discussed. Anticipatory guidance regarding healthy weight, lifestyle and choices given.   2. White coat syndrome with high blood pressure without hypertension Reports normal BP at home. Cont home monitoring. Discussed RTC precautions  3. Pure hypercholesterolemia Checking labs today, medications will be adjusted as needed.  - Comprehensive metabolic panel - Lipid panel  Other orders - simvastatin (ZOCOR) 20 MG tablet; Take 1 tablet (20 mg total) by mouth every other day.  Return for Dr Alvy Bimler .    Myles Lipps, MD Primary Care at [facility] [address] [place], [place] [address] Ph.  [phone] Fax [phone]

## 2020-07-28 NOTE — Patient Instructions (Addendum)
Next pap due may 2023    If you have lab work done today you will be contacted with your lab results within the next 2 weeks.  If you have not heard from [place] then please contact us. The fastest way to get your results is to register for My Chart.   IF you received an x-ray today, you will receive an invoice from [facility]. Please contact [facility] at [phone] with questions or concerns regarding your invoice.   IF you received labwork today, you will receive an invoice from [facility]. Please contact [facility] at [phone] with questions or concerns regarding your invoice.   Our billing staff will not be able to assist you with questions regarding bills from these companies.  You will be contacted with the lab results as soon as they are available. The fastest way to get your results is to activate your My Chart account. Instructions are located on the last page of this paperwork. If you have not heard from [place] regarding the results in 2 weeks, please contact this office.      Preventive Care 52-67 Years Old, Female Preventive care refers to visits with your health care provider and lifestyle choices that can promote health and wellness. This includes:  A yearly physical exam. This may also be called an annual well check.  Regular dental visits and eye exams.  Immunizations.  Screening for certain conditions.  Healthy lifestyle choices, such as eating a healthy diet, getting regular exercise, not using drugs or products that contain nicotine and tobacco, and limiting alcohol use. What can I expect for my preventive care visit? Physical exam Your health care provider will check your:  Height and weight. This may be used to calculate body mass index (BMI), which tells if you are at a healthy weight.  Heart rate and blood pressure.  Skin for abnormal spots. Counseling Your health care provider may ask you questions about your:  Alcohol, tobacco, and drug  use.  Emotional well-being.  Home and relationship well-being.  Sexual activity.  Eating habits.  Work and work Statistician.  Method of birth control.  Menstrual cycle.  Pregnancy history. What immunizations do I need?  Influenza (flu) vaccine  This is recommended every year. Tetanus, diphtheria, and pertussis (Tdap) vaccine  You may need a Td booster every 10 years. Varicella (chickenpox) vaccine  You may need this if you have not been vaccinated. Zoster (shingles) vaccine  You may need this after age 95. Measles, mumps, and rubella (MMR) vaccine  You may need at least one dose of MMR if you were born in 1957 or later. You may also need a second dose. Pneumococcal conjugate (PCV13) vaccine  You may need this if you have certain conditions and were not previously vaccinated. Pneumococcal polysaccharide (PPSV23) vaccine  You may need one or two doses if you smoke cigarettes or if you have certain conditions. Meningococcal conjugate (MenACWY) vaccine  You may need this if you have certain conditions. Hepatitis A vaccine  You may need this if you have certain conditions or if you travel or work in places where you may be exposed to hepatitis A. Hepatitis B vaccine  You may need this if you have certain conditions or if you travel or work in places where you may be exposed to hepatitis B. Haemophilus influenzae type b (Hib) vaccine  You may need this if you have certain conditions. Human papillomavirus (HPV) vaccine  If recommended by your health care provider, you may  need three doses over 6 months. You may receive vaccines as individual doses or as more than one vaccine together in one shot (combination vaccines). Talk with your health care provider about the risks and benefits of combination vaccines. What tests do I need? Blood tests  Lipid and cholesterol levels. These may be checked every 5 years, or more frequently if you are over 16 years  old.  Hepatitis C test.  Hepatitis B test. Screening  Lung cancer screening. You may have this screening every year starting at age 39 if you have a 30-pack-year history of smoking and currently smoke or have quit within the past 15 years.  Colorectal cancer screening. All adults should have this screening starting at age 37 and continuing until age 66. Your health care provider may recommend screening at age 51 if you are at increased risk. You will have tests every 1-10 years, depending on your results and the type of screening test.  Diabetes screening. This is done by checking your blood sugar (glucose) after you have not eaten for a while (fasting). You may have this done every 1-3 years.  Mammogram. This may be done every 1-2 years. Talk with your health care provider about when you should start having regular mammograms. This may depend on whether you have a family history of breast cancer.  BRCA-related cancer screening. This may be done if you have a family history of breast, ovarian, tubal, or peritoneal cancers.  Pelvic exam and Pap test. This may be done every 3 years starting at age 55. Starting at age 15, this may be done every 5 years if you have a Pap test in combination with an HPV test. Other tests  Sexually transmitted disease (STD) testing.  Bone density scan. This is done to screen for osteoporosis. You may have this scan if you are at high risk for osteoporosis. Follow these instructions at home: Eating and drinking  Eat a diet that includes fresh fruits and vegetables, whole grains, lean protein, and low-fat dairy.  Take vitamin and mineral supplements as recommended by your health care provider.  Do not drink alcohol if: ? Your health care provider tells you not to drink. ? You are pregnant, may be pregnant, or are planning to become pregnant.  If you drink alcohol: ? Limit how much you have to 0-1 drink a day. ? Be aware of how much alcohol is in your  drink. In the [place] one drink equals one 12 oz bottle of beer (355 mL), one 5 oz glass of wine (148 mL), or one 1 oz glass of hard liquor (44 mL). Lifestyle  Take daily care of your teeth and gums.  Stay active. Exercise for at least 30 minutes on 5 or more days each week.  Do not use any products that contain nicotine or tobacco, such as cigarettes, e-cigarettes, and chewing tobacco. If you need help quitting, ask your health care provider.  If you are sexually active, practice safe sex. Use a condom or other form of birth control (contraception) in order to prevent pregnancy and STIs (sexually transmitted infections).  If told by your health care provider, take low-dose aspirin daily starting at age 82. What's next?  Visit your health care provider once a year for a well check visit.  Ask your health care provider how often you should have your eyes and teeth checked.  Stay up to date on all vaccines. This information is not intended to replace advice given to you by  your health care provider. Make sure you discuss any questions you have with your health care provider. Document Revised: 06/26/2018 Document Reviewed: 06/26/2018 Elsevier Patient Education  2020 [employer].

## 2020-07-29 LAB — COMPREHENSIVE METABOLIC PANEL
ALT: 25 IU/L (ref 0–32)
AST: 23 IU/L (ref 0–40)
Albumin/Globulin Ratio: 1.5 (ref 1.2–2.2)
Albumin: 4.7 g/dL (ref 3.8–4.8)
Alkaline Phosphatase: 102 IU/L (ref 44–121)
BUN/Creatinine Ratio: 16 (ref 12–28)
BUN: 11 mg/dL (ref 8–27)
Bilirubin Total: 0.4 mg/dL (ref 0.0–1.2)
CO2: 24 mmol/L (ref 20–29)
Calcium: 9.8 mg/dL (ref 8.7–10.3)
Chloride: 105 mmol/L (ref 96–106)
Creatinine, Ser: 0.67 mg/dL (ref 0.57–1.00)
GFR calc Af Amer: 110 mL/min/{1.73_m2} (ref >59)
GFR calc non Af Amer: 95 mL/min/{1.73_m2} (ref >59)
Globulin, Total: 3.2 g/dL (ref 1.5–4.5)
Glucose: 127 mg/dL — ABNORMAL HIGH (ref 65–99)
Potassium: 3.9 mmol/L (ref 3.5–5.2)
Sodium: 144 mmol/L (ref 134–144)
Total Protein: 7.9 g/dL (ref 6.0–8.5)

## 2020-07-29 LAB — LIPID PANEL
Chol/HDL Ratio: 2.7 ratio (ref 0.0–4.4)
Cholesterol, Total: 182 mg/dL (ref 100–199)
HDL: 68 mg/dL (ref >39)
LDL Chol Calc (NIH): 86 mg/dL (ref 0–99)
Triglycerides: 163 mg/dL — ABNORMAL HIGH (ref 0–149)
VLDL Cholesterol Cal: 28 mg/dL (ref 5–40)

## 2021-01-12 ENCOUNTER — Telehealth: Payer: Self-pay

## 2021-01-12 NOTE — Telephone Encounter (Signed)
Medical release completed. Will be mailed to office. File too large for fax

## 2021-01-26 ENCOUNTER — Encounter: Payer: BC Managed Care – PPO | Admitting: Emergency Medicine

## 2021-03-09 DIAGNOSIS — H57053 Tonic pupil, bilateral: Secondary | ICD-10-CM | POA: Diagnosis not present

## 2021-03-09 DIAGNOSIS — M7552 Bursitis of left shoulder: Secondary | ICD-10-CM | POA: Diagnosis not present

## 2021-03-09 DIAGNOSIS — M7551 Bursitis of right shoulder: Secondary | ICD-10-CM | POA: Diagnosis not present

## 2021-03-09 DIAGNOSIS — E7800 Pure hypercholesterolemia, unspecified: Secondary | ICD-10-CM | POA: Diagnosis not present

## 2021-08-25 DIAGNOSIS — E7800 Pure hypercholesterolemia, unspecified: Secondary | ICD-10-CM | POA: Diagnosis not present

## 2021-08-25 DIAGNOSIS — M7551 Bursitis of right shoulder: Secondary | ICD-10-CM | POA: Diagnosis not present

## 2021-08-25 DIAGNOSIS — Z0000 Encounter for general adult medical examination without abnormal findings: Secondary | ICD-10-CM | POA: Diagnosis not present

## 2022-11-07 DIAGNOSIS — H1131 Conjunctival hemorrhage, right eye: Secondary | ICD-10-CM | POA: Diagnosis not present

## 2022-11-09 DIAGNOSIS — Z0000 Encounter for general adult medical examination without abnormal findings: Secondary | ICD-10-CM | POA: Diagnosis not present

## 2022-11-09 DIAGNOSIS — Z124 Encounter for screening for malignant neoplasm of cervix: Secondary | ICD-10-CM | POA: Diagnosis not present

## 2022-11-09 DIAGNOSIS — E7800 Pure hypercholesterolemia, unspecified: Secondary | ICD-10-CM | POA: Diagnosis not present

## 2022-11-09 DIAGNOSIS — M7552 Bursitis of left shoulder: Secondary | ICD-10-CM | POA: Diagnosis not present

## 2022-12-04 DIAGNOSIS — E7800 Pure hypercholesterolemia, unspecified: Secondary | ICD-10-CM | POA: Diagnosis not present

## 2022-12-04 DIAGNOSIS — Z0000 Encounter for general adult medical examination without abnormal findings: Secondary | ICD-10-CM | POA: Diagnosis not present
